# Patient Record
Sex: Male | Born: 1965
Health system: Southern US, Community
[De-identification: ages and names within clinical notes are randomized; demographics above are authoritative.]

## PROBLEM LIST (undated history)

## (undated) DIAGNOSIS — F32A Depression, unspecified: Secondary | ICD-10-CM

## (undated) DIAGNOSIS — F329 Major depressive disorder, single episode, unspecified: Secondary | ICD-10-CM

## (undated) DIAGNOSIS — I10 Essential (primary) hypertension: Secondary | ICD-10-CM

## (undated) DIAGNOSIS — G8929 Other chronic pain: Secondary | ICD-10-CM

## (undated) DIAGNOSIS — E119 Type 2 diabetes mellitus without complications: Secondary | ICD-10-CM

## (undated) DIAGNOSIS — T7840XA Allergy, unspecified, initial encounter: Secondary | ICD-10-CM

## (undated) HISTORY — PX: KNEE SURGERY: SHX244

## (undated) HISTORY — PX: BACK SURGERY: SHX140

## (undated) HISTORY — DX: Other chronic pain: G89.29

## (undated) HISTORY — DX: Depression, unspecified: F32.A

---
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
## 2004-09-10 ENCOUNTER — Ambulatory Visit: Payer: Self-pay | Admitting: Family Medicine

## 2004-10-08 ENCOUNTER — Ambulatory Visit: Payer: Self-pay | Admitting: Family Medicine

## 2005-01-12 ENCOUNTER — Emergency Department: Payer: Self-pay | Admitting: Emergency Medicine

## 2005-06-11 ENCOUNTER — Inpatient Hospital Stay (HOSPITAL_COMMUNITY): Admission: RE | Admit: 2005-06-11 | Discharge: 2005-06-14 | Payer: Self-pay | Admitting: Neurosurgery

## 2006-08-13 ENCOUNTER — Encounter: Admission: RE | Admit: 2006-08-13 | Discharge: 2006-08-13 | Payer: Self-pay | Admitting: Neurosurgery

## 2006-09-08 ENCOUNTER — Encounter: Admission: RE | Admit: 2006-09-08 | Discharge: 2006-09-08 | Payer: Self-pay | Admitting: Neurosurgery

## 2007-02-26 ENCOUNTER — Encounter: Admission: RE | Admit: 2007-02-26 | Discharge: 2007-02-26 | Payer: Self-pay | Admitting: Neurosurgery

## 2008-05-30 ENCOUNTER — Ambulatory Visit: Payer: Self-pay | Admitting: Specialist

## 2008-06-15 ENCOUNTER — Ambulatory Visit: Payer: Self-pay | Admitting: Specialist

## 2008-06-22 ENCOUNTER — Ambulatory Visit: Payer: Self-pay | Admitting: Specialist

## 2009-08-09 ENCOUNTER — Ambulatory Visit: Payer: Self-pay | Admitting: Pain Medicine

## 2009-08-21 ENCOUNTER — Ambulatory Visit: Payer: Self-pay | Admitting: Pain Medicine

## 2009-08-28 ENCOUNTER — Ambulatory Visit: Payer: Self-pay | Admitting: Pain Medicine

## 2009-09-07 ENCOUNTER — Ambulatory Visit: Payer: Self-pay | Admitting: Pain Medicine

## 2009-09-25 ENCOUNTER — Ambulatory Visit: Payer: Self-pay | Admitting: Pain Medicine

## 2009-11-02 ENCOUNTER — Ambulatory Visit: Payer: Self-pay | Admitting: Pain Medicine

## 2009-11-14 ENCOUNTER — Ambulatory Visit: Payer: Self-pay | Admitting: Pain Medicine

## 2009-11-21 ENCOUNTER — Ambulatory Visit: Payer: Self-pay | Admitting: Pain Medicine

## 2009-12-13 ENCOUNTER — Ambulatory Visit: Payer: Self-pay | Admitting: Pain Medicine

## 2009-12-19 ENCOUNTER — Ambulatory Visit: Payer: Self-pay | Admitting: Pain Medicine

## 2009-12-28 ENCOUNTER — Ambulatory Visit: Payer: Self-pay | Admitting: Pain Medicine

## 2010-07-01 ENCOUNTER — Encounter: Payer: Self-pay | Admitting: Neurosurgery
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
## 2011-07-21 ENCOUNTER — Ambulatory Visit: Payer: Self-pay | Admitting: Specialist

## 2011-08-11 ENCOUNTER — Emergency Department: Payer: Self-pay | Admitting: Unknown Physician Specialty

## 2011-08-11 LAB — BASIC METABOLIC PANEL
Co2: 26 mmol/L (ref 21–32)
Creatinine: 0.95 mg/dL (ref 0.60–1.30)
EGFR (African American): >60
Sodium: 130 mmol/L — ABNORMAL LOW (ref 136–145)

## 2011-08-11 LAB — HEPATIC FUNCTION PANEL A (ARMC)
Bilirubin, Direct: 0.1 mg/dL (ref 0.00–0.20)
SGOT(AST): 16 U/L (ref 15–37)

## 2011-08-11 LAB — CBC
MCH: 29.5 pg (ref 26.0–34.0)
MCHC: 34.2 g/dL (ref 32.0–36.0)
RDW: 12.3 % (ref 11.5–14.5)

## 2011-08-11 LAB — TROPONIN I: Troponin-I: <0.02 ng/mL

## 2011-11-05 ENCOUNTER — Ambulatory Visit: Payer: Self-pay | Admitting: Pain Medicine

## 2011-11-07 ENCOUNTER — Ambulatory Visit: Payer: Self-pay | Admitting: Pain Medicine

## 2011-12-03 ENCOUNTER — Ambulatory Visit: Payer: Self-pay | Admitting: Pain Medicine

## 2011-12-11 ENCOUNTER — Ambulatory Visit: Payer: Self-pay | Admitting: Pain Medicine

## 2011-12-20 ENCOUNTER — Other Ambulatory Visit: Payer: Self-pay | Admitting: Pain Medicine

## 2011-12-20 LAB — HEMOGLOBIN A1C: Hemoglobin A1C: 8.3 % — ABNORMAL HIGH (ref 4.2–6.3)

## 2011-12-20 LAB — GLUCOSE, RANDOM: Glucose: 198 mg/dL — ABNORMAL HIGH (ref 65–99)

## 2011-12-24 ENCOUNTER — Ambulatory Visit: Payer: Self-pay | Admitting: Pain Medicine

## 2012-01-13 ENCOUNTER — Ambulatory Visit: Payer: Self-pay | Admitting: Pain Medicine

## 2012-01-14 ENCOUNTER — Ambulatory Visit: Payer: Self-pay | Admitting: Pain Medicine

## 2012-01-27 ENCOUNTER — Ambulatory Visit: Payer: Self-pay | Admitting: Pain Medicine

## 2012-03-16 ENCOUNTER — Ambulatory Visit: Payer: Self-pay | Admitting: Pain Medicine

## 2012-05-13 ENCOUNTER — Ambulatory Visit: Payer: Self-pay | Admitting: Pain Medicine

## 2012-07-13 ENCOUNTER — Ambulatory Visit: Payer: Self-pay | Admitting: Pain Medicine

## 2012-08-10 ENCOUNTER — Ambulatory Visit: Payer: Self-pay | Admitting: Pain Medicine

## 2012-09-11 ENCOUNTER — Ambulatory Visit: Payer: Self-pay | Admitting: Pain Medicine

## 2012-09-16 ENCOUNTER — Ambulatory Visit: Payer: Self-pay | Admitting: Specialist

## 2012-10-09 ENCOUNTER — Ambulatory Visit: Payer: Self-pay | Admitting: Pain Medicine

## 2012-10-15 ENCOUNTER — Ambulatory Visit: Payer: Self-pay | Admitting: Specialist

## 2012-10-15 LAB — BASIC METABOLIC PANEL
Anion Gap: 10 (ref 7–16)
Creatinine: 0.69 mg/dL (ref 0.60–1.30)
EGFR (African American): >60
Potassium: 4 mmol/L (ref 3.5–5.1)

## 2012-11-06 ENCOUNTER — Ambulatory Visit: Payer: Self-pay | Admitting: Pain Medicine

## 2012-11-26 ENCOUNTER — Ambulatory Visit: Payer: Self-pay | Admitting: Specialist

## 2012-12-04 ENCOUNTER — Ambulatory Visit: Payer: Self-pay | Admitting: Pain Medicine

## 2014-09-30 NOTE — Op Note (Signed)
PATIENT NAME:  Kevin Huff, [name] MR#:  [id number] DATE OF BIRTH:  [birth year]  DATE OF PROCEDURE:  11/26/2012  PREOPERATIVE DIAGNOSIS: Probable medial meniscus tear, left knee.   POSTOPERATIVE DIAGNOSES:  1. Tear, posterior horn, lateral meniscus, left knee.  2. Large hypertrophic medial suprapatellar plica.   PROCEDURES:  1. Arthroscopic partial lateral meniscectomy, left knee.  2. Excision of large medial suprapatellar plica, left knee.   SURGEON: Clare Gandyhristopher E. Kinan Safley, MD  ANESTHESIA: General.   COMPLICATIONS: None.   DESCRIPTION OF PROCEDURE: After adequate induction of general anesthesia, the left leg is placed in the legholder in the usual manner for arthroscopy. The left knee and leg are thoroughly prepped with alcohol and ChloraPrep and draped in standard sterile fashion. The joint is infiltrated with 10 mL of Marcaine with epinephrine. Diagnostic arthroscopy is performed. There is minimal synovitis present. The anterior femoral surface is normal. There is mild chondromalacia of the posterior aspect of the patella. There is a large medial suprapatellar hypertrophic plica present with some irritation of the medial femoral condyle. Using a combination of the full radial resector and the ArthroWand, the complete medial suprapatellar plica is completely excised. The medial compartment is then examined, and no abnormalities are noted. The medial meniscus is normal. In the intercondylar notch, the anterior cruciate ligament is normal. In the lateral compartment, there is seen to be a bucket-handle type tear of the posterior horn of the lateral meniscus. This is thoroughly probed, and then the torn portion of the meniscus is resected using the ArthroWand, the basket forceps and the full radial resector. The joint is then thoroughly irrigated multiple times. Skin is closed with 5-0 nylon. The joint is infiltrated with 16 mL of Marcaine with epinephrine with 4 mg of morphine. A soft bulky dressing is  applied. The patient is returned to the recovery room in satisfactory condition, having tolerated the procedure quite well.    ____________________________ Clare Gandyhristopher E. Tammala Weider, MD ces:OSi D: 11/27/2012 06:25:01 ET T: 11/27/2012 06:46:20 ET JOB#: [id number]  cc: Clare Gandyhristopher E. Everson Mott, MD, <Dictator> Clare GandyHRISTOPHER E Loreli Debruler MD ELECTRONICALLY SIGNED 11/27/2012 7:16

## 2015-07-22 ENCOUNTER — Emergency Department: Payer: No Typology Code available for payment source

## 2015-07-22 ENCOUNTER — Emergency Department
Admission: EM | Admit: 2015-07-22 | Discharge: 2015-07-22 | Disposition: A | Discharge status: Home / Self Care | Payer: No Typology Code available for payment source | Attending: Emergency Medicine | Admitting: Emergency Medicine

## 2015-07-22 ENCOUNTER — Encounter: Payer: Self-pay | Admitting: Emergency Medicine

## 2015-07-22 DIAGNOSIS — F1210 Cannabis abuse, uncomplicated: Secondary | ICD-10-CM | POA: Diagnosis not present

## 2015-07-22 DIAGNOSIS — S3992XA Unspecified injury of lower back, initial encounter: Secondary | ICD-10-CM | POA: Insufficient documentation

## 2015-07-22 DIAGNOSIS — F1310 Sedative, hypnotic or anxiolytic abuse, uncomplicated: Secondary | ICD-10-CM | POA: Diagnosis not present

## 2015-07-22 DIAGNOSIS — J439 Emphysema, unspecified: Secondary | ICD-10-CM | POA: Insufficient documentation

## 2015-07-22 DIAGNOSIS — Z880 Allergy status to penicillin: Secondary | ICD-10-CM | POA: Diagnosis not present

## 2015-07-22 DIAGNOSIS — S8991XA Unspecified injury of right lower leg, initial encounter: Secondary | ICD-10-CM | POA: Diagnosis not present

## 2015-07-22 DIAGNOSIS — S42102A Fracture of unspecified part of scapula, left shoulder, initial encounter for closed fracture: Secondary | ICD-10-CM | POA: Diagnosis not present

## 2015-07-22 DIAGNOSIS — Y998 Other external cause status: Secondary | ICD-10-CM | POA: Insufficient documentation

## 2015-07-22 DIAGNOSIS — Z9889 Other specified postprocedural states: Secondary | ICD-10-CM | POA: Diagnosis not present

## 2015-07-22 DIAGNOSIS — S27329A Contusion of lung, unspecified, initial encounter: Secondary | ICD-10-CM

## 2015-07-22 DIAGNOSIS — S199XXA Unspecified injury of neck, initial encounter: Secondary | ICD-10-CM | POA: Insufficient documentation

## 2015-07-22 DIAGNOSIS — Y92410 Unspecified street and highway as the place of occurrence of the external cause: Secondary | ICD-10-CM | POA: Diagnosis not present

## 2015-07-22 DIAGNOSIS — E119 Type 2 diabetes mellitus without complications: Secondary | ICD-10-CM | POA: Insufficient documentation

## 2015-07-22 DIAGNOSIS — I10 Essential (primary) hypertension: Secondary | ICD-10-CM | POA: Diagnosis not present

## 2015-07-22 DIAGNOSIS — S29001A Unspecified injury of muscle and tendon of front wall of thorax, initial encounter: Secondary | ICD-10-CM | POA: Diagnosis present

## 2015-07-22 DIAGNOSIS — Y9389 Activity, other specified: Secondary | ICD-10-CM | POA: Insufficient documentation

## 2015-07-22 HISTORY — DX: Essential (primary) hypertension: I10

## 2015-07-22 HISTORY — DX: Type 2 diabetes mellitus without complications: E11.9

## 2015-07-22 LAB — URINALYSIS COMPLETE WITH MICROSCOPIC (ARMC ONLY)
Bacteria, UA: NONE SEEN
Bilirubin Urine: NEGATIVE
Glucose, UA: >500 mg/dL — AB
Hgb urine dipstick: NEGATIVE
Ketones, ur: NEGATIVE mg/dL
Leukocytes, UA: NEGATIVE
Nitrite: NEGATIVE
PROTEIN: NEGATIVE mg/dL
RBC / HPF: NONE SEEN RBC/hpf (ref 0–5)
SQUAMOUS EPITHELIAL / LPF: NONE SEEN
Specific Gravity, Urine: 1.039 — ABNORMAL HIGH (ref 1.005–1.030)
WBC UA: NONE SEEN WBC/hpf (ref 0–5)
pH: 5 (ref 5.0–8.0)

## 2015-07-22 LAB — CBC
HEMATOCRIT: 46.7 % (ref 40.0–52.0)
HEMOGLOBIN: 15.9 g/dL (ref 13.0–18.0)
MCH: 28.9 pg (ref 26.0–34.0)
MCHC: 34 g/dL (ref 32.0–36.0)
MCV: 85.2 fL (ref 80.0–100.0)
Platelets: 220 10*3/uL (ref 150–440)
RBC: 5.48 MIL/uL (ref 4.40–5.90)
RDW: 13.7 % (ref 11.5–14.5)
WBC: 7.2 10*3/uL (ref 3.8–10.6)

## 2015-07-22 LAB — URINE DRUG SCREEN, QUALITATIVE (ARMC ONLY)
AMPHETAMINES, UR SCREEN: NOT DETECTED
BARBITURATES, UR SCREEN: NOT DETECTED
BENZODIAZEPINE, UR SCRN: POSITIVE — AB
CANNABINOID 50 NG, UR ~~LOC~~: POSITIVE — AB
Cocaine Metabolite,Ur ~~LOC~~: NOT DETECTED
MDMA (Ecstasy)Ur Screen: NOT DETECTED
Methadone Scn, Ur: NOT DETECTED
OPIATE, UR SCREEN: NOT DETECTED
PHENCYCLIDINE (PCP) UR S: NOT DETECTED
Tricyclic, Ur Screen: NOT DETECTED

## 2015-07-22 LAB — BASIC METABOLIC PANEL
ANION GAP: 7 (ref 5–15)
BUN: 12 mg/dL (ref 6–20)
CALCIUM: 9.6 mg/dL (ref 8.9–10.3)
CO2: 29 mmol/L (ref 22–32)
Chloride: 104 mmol/L (ref 101–111)
Creatinine, Ser: 0.86 mg/dL (ref 0.61–1.24)
GLUCOSE: 238 mg/dL — AB (ref 65–99)
POTASSIUM: 4.4 mmol/L (ref 3.5–5.1)
Sodium: 140 mmol/L (ref 135–145)

## 2015-07-22 LAB — TROPONIN I

## 2015-07-22 LAB — ETHANOL

## 2015-07-22 MED ORDER — SODIUM CHLORIDE 0.9 % IV BOLUS (SEPSIS)
1000.0000 mL | Freq: Once | INTRAVENOUS | Status: AC
  Administered 2015-07-22: 1000 mL via INTRAVENOUS

## 2015-07-22 MED ORDER — IOHEXOL 300 MG/ML  SOLN
125.0000 mL | Freq: Once | INTRAMUSCULAR | Status: AC | PRN
  Administered 2015-07-22: 125 mL via INTRAVENOUS

## 2015-07-22 MED ORDER — FENTANYL CITRATE (PF) 100 MCG/2ML IJ SOLN: 100.0000 ug | Freq: Once | INTRAMUSCULAR | Status: DC

## 2015-07-22 MED ORDER — FENTANYL CITRATE (PF) 100 MCG/2ML IJ SOLN
50.0000 ug | Freq: Once | INTRAMUSCULAR | Status: AC
  Administered 2015-07-22: 50 ug via INTRAVENOUS
  Filled 2015-07-22: qty 2

## 2015-07-22 NOTE — ED Notes (Signed)
Pt transported to CT ?
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
## 2015-07-22 NOTE — ED Notes (Signed)
Pt returned from CT °

## 2015-07-22 NOTE — ED Notes (Signed)
NAD noted at time of D/C. Pt taken to the lobby via wheelchair by his wife. Pt states "I wish i could get my pain below a 10." Per Dr. Pershing Proud, pt is to take his 10-325 Percocet that he has at home when he gets home, then take as prescribed Q6. Pt states "But i'll be in bed asleep by the time it is time to take my next pill". This RN instructed patient that per Dr. Pershing Proud, take percocet when he gets home then as prescribed.

## 2015-07-26 ENCOUNTER — Other Ambulatory Visit: Payer: Self-pay | Admitting: Internal Medicine

## 2015-07-26 DIAGNOSIS — S27321A Contusion of lung, unilateral, initial encounter: Secondary | ICD-10-CM

## 2015-08-01 ENCOUNTER — Ambulatory Visit
Admission: RE | Admit: 2015-08-01 | Discharge: 2015-08-01 | Disposition: A | Discharge status: Home / Self Care | Payer: Medicare Other | Source: Ambulatory Visit | Attending: Internal Medicine | Admitting: Internal Medicine

## 2015-08-01 DIAGNOSIS — X58XXXS Exposure to other specified factors, sequela: Secondary | ICD-10-CM | POA: Diagnosis not present

## 2015-08-01 DIAGNOSIS — S27321S Contusion of lung, unilateral, sequela: Secondary | ICD-10-CM | POA: Insufficient documentation

## 2015-08-01 DIAGNOSIS — I2510 Atherosclerotic heart disease of native coronary artery without angina pectoris: Secondary | ICD-10-CM | POA: Insufficient documentation

## 2015-08-01 DIAGNOSIS — J439 Emphysema, unspecified: Secondary | ICD-10-CM | POA: Diagnosis not present

## 2015-08-01 DIAGNOSIS — S27321A Contusion of lung, unilateral, initial encounter: Secondary | ICD-10-CM

## 2015-08-01 MED ORDER — IOHEXOL 350 MG/ML SOLN
75.0000 mL | Freq: Once | INTRAVENOUS | Status: AC | PRN
  Administered 2015-08-01: 75 mL via INTRAVENOUS

## 2015-09-05 ENCOUNTER — Ambulatory Visit: Payer: Medicare Other | Attending: Sports Medicine | Admitting: Physical Therapy

## 2015-09-05 DIAGNOSIS — X58XXXA Exposure to other specified factors, initial encounter: Secondary | ICD-10-CM | POA: Insufficient documentation

## 2015-09-05 DIAGNOSIS — S42102A Fracture of unspecified part of scapula, left shoulder, initial encounter for closed fracture: Secondary | ICD-10-CM | POA: Insufficient documentation

## 2015-09-05 DIAGNOSIS — M7582 Other shoulder lesions, left shoulder: Secondary | ICD-10-CM | POA: Diagnosis present

## 2015-09-05 DIAGNOSIS — M25512 Pain in left shoulder: Secondary | ICD-10-CM | POA: Diagnosis present

## 2015-09-05 DIAGNOSIS — M25612 Stiffness of left shoulder, not elsewhere classified: Secondary | ICD-10-CM

## 2015-09-05 NOTE — Patient Instructions (Signed)
All exercises provided were adapted from [email or web address]. Patient was provided a written handout with pictures as described. Any additional cues were manually entered in to handout and copied in to this document.  SCAPULAR RETRACTIONS  Draw your shoulder blades back and down.    Seated Thoracic Extension  Sit with your back against the top of a chair or with foam roll/noodle behind and perpendicular to your spine. Support your neck with your hands and tuck elbows forward and together. Gently arch back to mobilize spine at level where chair contacts spine. Hold 1-2 seconds then slowly return. Repeat. Repeat at 3 separate levels throughout mid-back, starting down near bottom of ribs and moving up toward top of shoulder blades.
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
## 2015-09-07 ENCOUNTER — Ambulatory Visit: Payer: Medicare Other | Admitting: Physical Therapy

## 2015-09-07 DIAGNOSIS — M25612 Stiffness of left shoulder, not elsewhere classified: Secondary | ICD-10-CM

## 2015-09-07 DIAGNOSIS — S42102A Fracture of unspecified part of scapula, left shoulder, initial encounter for closed fracture: Secondary | ICD-10-CM | POA: Diagnosis not present

## 2015-09-07 DIAGNOSIS — M25512 Pain in left shoulder: Secondary | ICD-10-CM
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
## 2015-09-12 ENCOUNTER — Ambulatory Visit: Payer: Medicare Other | Attending: Sports Medicine | Admitting: Physical Therapy

## 2015-09-12 DIAGNOSIS — M25512 Pain in left shoulder: Secondary | ICD-10-CM | POA: Diagnosis present

## 2015-09-12 DIAGNOSIS — M7582 Other shoulder lesions, left shoulder: Secondary | ICD-10-CM | POA: Diagnosis present

## 2015-09-12 DIAGNOSIS — M25612 Stiffness of left shoulder, not elsewhere classified: Secondary | ICD-10-CM

## 2015-09-12 DIAGNOSIS — M79602 Pain in left arm: Secondary | ICD-10-CM | POA: Insufficient documentation

## 2015-09-12 DIAGNOSIS — X58XXXA Exposure to other specified factors, initial encounter: Secondary | ICD-10-CM | POA: Diagnosis not present

## 2015-09-12 DIAGNOSIS — S42102A Fracture of unspecified part of scapula, left shoulder, initial encounter for closed fracture: Secondary | ICD-10-CM | POA: Diagnosis not present
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
## 2015-09-14 ENCOUNTER — Ambulatory Visit: Payer: Medicare Other | Admitting: Physical Therapy

## 2015-09-14 ENCOUNTER — Encounter: Payer: Medicare Other | Admitting: Physical Therapy

## 2015-09-19 ENCOUNTER — Ambulatory Visit: Payer: Medicare Other | Admitting: Physical Therapy

## 2015-09-19 DIAGNOSIS — M25612 Stiffness of left shoulder, not elsewhere classified: Secondary | ICD-10-CM

## 2015-09-19 DIAGNOSIS — S42102A Fracture of unspecified part of scapula, left shoulder, initial encounter for closed fracture: Secondary | ICD-10-CM | POA: Diagnosis not present

## 2015-09-19 DIAGNOSIS — M25512 Pain in left shoulder: Secondary | ICD-10-CM
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
## 2015-09-21 ENCOUNTER — Ambulatory Visit: Payer: Medicare Other | Admitting: Physical Therapy

## 2015-09-21 DIAGNOSIS — S42102A Fracture of unspecified part of scapula, left shoulder, initial encounter for closed fracture: Secondary | ICD-10-CM | POA: Diagnosis not present

## 2015-09-21 DIAGNOSIS — M79602 Pain in left arm: Secondary | ICD-10-CM
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
## 2015-09-26 ENCOUNTER — Ambulatory Visit: Payer: Medicare Other

## 2015-09-26 DIAGNOSIS — M79602 Pain in left arm: Secondary | ICD-10-CM

## 2015-09-26 DIAGNOSIS — M25512 Pain in left shoulder: Secondary | ICD-10-CM

## 2015-09-26 DIAGNOSIS — M25612 Stiffness of left shoulder, not elsewhere classified: Secondary | ICD-10-CM

## 2015-09-26 DIAGNOSIS — S42102A Fracture of unspecified part of scapula, left shoulder, initial encounter for closed fracture: Secondary | ICD-10-CM | POA: Diagnosis not present
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
## 2015-09-28 ENCOUNTER — Ambulatory Visit: Payer: Medicare Other | Admitting: Physical Therapy

## 2015-10-03 ENCOUNTER — Ambulatory Visit: Payer: Medicare Other | Admitting: Physical Therapy

## 2015-10-03 DIAGNOSIS — M79602 Pain in left arm: Secondary | ICD-10-CM

## 2015-10-03 DIAGNOSIS — S42102A Fracture of unspecified part of scapula, left shoulder, initial encounter for closed fracture: Secondary | ICD-10-CM | POA: Diagnosis not present
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
## 2015-10-05 ENCOUNTER — Ambulatory Visit: Payer: Medicare Other | Admitting: Physical Therapy

## 2015-10-05 DIAGNOSIS — M79602 Pain in left arm: Secondary | ICD-10-CM

## 2015-10-05 DIAGNOSIS — S42102A Fracture of unspecified part of scapula, left shoulder, initial encounter for closed fracture: Secondary | ICD-10-CM | POA: Diagnosis not present
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
## 2015-10-10 ENCOUNTER — Encounter: Payer: Medicare Other | Admitting: Physical Therapy

## 2017-11-26 ENCOUNTER — Encounter: Payer: Self-pay | Admitting: Family Medicine

## 2017-11-26 ENCOUNTER — Encounter: Payer: Self-pay | Admitting: Emergency Medicine

## 2017-11-26 ENCOUNTER — Ambulatory Visit (INDEPENDENT_AMBULATORY_CARE_PROVIDER_SITE_OTHER): Payer: Medicare Other | Admitting: Family Medicine

## 2017-11-26 VITALS — BP 130/82 | HR 90 | Temp 98.2°F | Ht 73.5 in | Wt 278.5 lb

## 2017-11-26 DIAGNOSIS — F1190 Opioid use, unspecified, uncomplicated: Secondary | ICD-10-CM

## 2017-11-26 DIAGNOSIS — F419 Anxiety disorder, unspecified: Secondary | ICD-10-CM

## 2017-11-26 DIAGNOSIS — Z7689 Persons encountering health services in other specified circumstances: Secondary | ICD-10-CM

## 2017-11-26 DIAGNOSIS — M545 Low back pain: Secondary | ICD-10-CM

## 2017-11-26 DIAGNOSIS — G4700 Insomnia, unspecified: Secondary | ICD-10-CM

## 2017-11-26 DIAGNOSIS — E559 Vitamin D deficiency, unspecified: Secondary | ICD-10-CM

## 2017-11-26 DIAGNOSIS — E1165 Type 2 diabetes mellitus with hyperglycemia: Secondary | ICD-10-CM | POA: Diagnosis not present

## 2017-11-26 DIAGNOSIS — Z794 Long term (current) use of insulin: Secondary | ICD-10-CM | POA: Diagnosis not present

## 2017-11-26 DIAGNOSIS — Z6836 Body mass index (BMI) 36.0-36.9, adult: Secondary | ICD-10-CM

## 2017-11-26 DIAGNOSIS — I10 Essential (primary) hypertension: Secondary | ICD-10-CM

## 2017-11-26 DIAGNOSIS — F32A Depression, unspecified: Secondary | ICD-10-CM

## 2017-11-26 DIAGNOSIS — G8929 Other chronic pain: Secondary | ICD-10-CM

## 2017-11-26 DIAGNOSIS — F329 Major depressive disorder, single episode, unspecified: Secondary | ICD-10-CM

## 2017-11-26 DIAGNOSIS — J3089 Other allergic rhinitis: Secondary | ICD-10-CM

## 2017-11-26 LAB — POCT GLYCOSYLATED HEMOGLOBIN (HGB A1C): Hemoglobin A1C: 11.1 % — AB (ref 4.0–5.6)

## 2017-11-26 MED ORDER — OXYCODONE-ACETAMINOPHEN 10-325 MG PO TABS: 1.0000 | ORAL_TABLET | Freq: Two times a day (BID) | ORAL | 0 refills | Status: DC | PRN

## 2017-11-26 MED ORDER — LISINOPRIL 20 MG PO TABS: 20.0000 mg | ORAL_TABLET | Freq: Every day | ORAL | 1 refills | Status: DC

## 2017-11-26 MED ORDER — MELOXICAM 15 MG PO TABS: 15.0000 mg | ORAL_TABLET | Freq: Every day | ORAL | 1 refills | Status: DC

## 2017-11-26 MED ORDER — ALPRAZOLAM 0.5 MG PO TABS: 0.5000 mg | ORAL_TABLET | Freq: Two times a day (BID) | ORAL | 0 refills | Status: DC | PRN

## 2017-11-26 MED ORDER — TRAZODONE HCL 100 MG PO TABS
100.0000 mg | ORAL_TABLET | Freq: Every evening | ORAL | 1 refills | Status: DC | PRN
Start: 2017-11-26 — End: 2018-02-27

## 2017-11-26 MED ORDER — METFORMIN HCL 1000 MG PO TABS: ORAL_TABLET | ORAL | 4 refills | Status: DC

## 2017-11-26 MED ORDER — GLIPIZIDE 5 MG PO TABS: 5.0000 mg | ORAL_TABLET | Freq: Two times a day (BID) | ORAL | 3 refills | Status: DC

## 2017-11-26 NOTE — Patient Instructions (Addendum)
Good to meet you today  For your allergies, you can take generic Zyrtec (cetirizine), Allegra (fexofenadine) or Claritin (loratadine)  Please call your insurance and ask them what the preferred long acting insulin is on your plan- Lantus, Basaglar or Toujeo and how much it will cost you. If it is prohibitively expensive, we may need to do a short acting more frequent medication. Call the office at [phone] and let me know what you find out.   Please schedule a follow up appointment for a physical exam in 1 month    Stop your Tujero and Jardiance, I have sent in new oral meds to your pharmacy  Check your blood sugar once a day and bring your meter with you to your next visit

## 2017-11-26 NOTE — Progress Notes (Signed)
Subjective:    Patient ID: Kevin Huff, male    DOB: [birth year], 52 y.o.   MRN: [id number]  HPI This is a 52 yo male who presents today to establish care. He is from [place] He is married, has a grown daughter, two grand children. Enjoys fishing. Has been disabled since 2006. Has had multiple back surgeries.  Was previously seeing Dr. Maryellen PileEason for last 10 years. He has retired. Patient is about to run out of all his medications.   DM type 2- currently on Toujeo 40 units daily, Jardiance 25 mg po qd. Does not watch diet carefully. Rarely checks his blood sugar. He is unable to afford these medications and was previously getting samples from Dr. Jake ChurchEason's office. He has not been taking his medications as prescribed because he has been trying to make them last. He is unable to recall what other medications he has been on for his diabetes and any intolerances/ ineffectiveness.   Chronic pain- has chronic back pain, has been unable to afford to see a specialist. Has been trying to make oxycodone- acetaminophen last. Also takes alprazolam for panic attacks. Takes 1 three times a day. Has had to use sparingly. Also takes meloxicam 15 mg daily- he is out of this. Has been on gabapentin and lyrica (2018 14 day supply on [facility]) in past without improvement of pain. Has been mostly confined to bed recently due to pain. Reports significant anxiety related to getting his prescriptions.   Last CPE- last year PSA- unknown Colonoscopy- never, declines Tdap- 06/26/2007 Flu- annual Dental- full dentures Eye-  2-3 years ago, unable to afford co-pay for eye exam Exercise- has not been able to exercise in last month due to pain. Has [employer] membership.  Sleep- improved with trazodone but is currently out  Denies, chest pain, SOB, abdominal pain, nausea/vomiting/diarrhea/constipation. Has frequent urination.   Past Medical History:  Diagnosis Date  . Diabetes mellitus without complication (HCC)   . Hypertension     Past Surgical History:  Procedure Laterality Date  . BACK SURGERY    . KNEE SURGERY     Family History  Problem Relation Age of Onset  . Cancer Mother   . Hyperlipidemia Father   . Hypertension Father   . Diabetes Father   . Heart disease Father    Social History   Tobacco Use  . Smoking status: Never Smoker  . Smokeless tobacco: Never Used  Substance Use Topics  . Alcohol use: No  . Drug use: Never     Review of Systems Per HPI    Objective:   Physical Exam  Constitutional: He is oriented to person, place, and time. He appears well-developed and well-nourished.  Obese.   HENT:  Head: Normocephalic and atraumatic.  Eyes: Conjunctivae are normal.  Cardiovascular: Normal rate.  Pulmonary/Chest: Effort normal.  Neurological: He is alert and oriented to person, place, and time.  Psychiatric: His mood appears anxious. He is agitated. Cognition and memory are normal.  Vitals reviewed.     BP 130/82 (BP Location: Right Arm, Patient Position: Sitting, Cuff Size: Large)   Pulse 90   Temp 98.2 F (36.8 C) (Oral)   Ht 6' 1.5" (1.867 m)   Wt 278 lb 8 oz (126.3 kg)   SpO2 97%   BMI 36.25 kg/m  Wt Readings from Last 3 Encounters:  11/26/17 278 lb 8 oz (126.3 kg)  07/22/15 275 lb (124.7 kg)   Dep[facility] screen [facility] 11/26/2017  Decreased Interest  1  Down, Depressed, Hopeless 1  PHQ - 2 Score 2   Results for orders placed or performed in visit on 11/26/17  HgB A1c  Result Value Ref Range   Hemoglobin A1C 11.1 (A) 4.0 - 5.6 %   HbA1c, POC (prediabetic range)  5.7 - 6.4 %   HbA1c, POC (controlled diabetic range)  0.0 - 7.0 %       Assessment & Plan:  1. Encounter to establish care - Will attempt to get records from previous PCP - Follow up in 1 month for CPE  2. Type 2 diabetes mellitus with hyperglycemia, with long-term current use of insulin (HCC) - hgba1c 11.1, has not been taking medications regularly. Unable to afford long acting insulin. Discussed  trying oral agents and importance of checking blood sugar daily and watching diet. Discussed potential side effects of medication - may need to use inexpensive short acting insulin if blood sugars not improved on oral agents - CBC with Differential - Comprehensive metabolic panel - HgB A1c - Lipid Panel - lisinopril (PRINIVIL,ZESTRIL) 20 MG tablet; Take 1 tablet (20 mg total) by mouth daily.  Dispense: 90 tablet; Refill: 1 - metFORMIN (GLUCOPHAGE) 1000 MG tablet; Take 1/2 tablet with dinner x 3 days, then 1/2 twice a day for 3 days, then 1 tablet twice a day with meals  Dispense: 60 tablet; Refill: 4 - glipiZIDE (GLUCOTROL) 5 MG tablet; Take 1 tablet (5 mg total) by mouth 2 (two) times daily before a meal.  Dispense: 60 tablet; Refill: 3  3. Essential hypertension - well controlled today, continue lisinopril for kidney protective benefits - Vitamin D, 25-hydroxy - lisinopril (PRINIVIL,ZESTRIL) 20 MG tablet; Take 1 tablet (20 mg total) by mouth daily.  Dispense: 90 tablet; Refill: 1  4. Chronic low back pain, unspecified back pain laterality, with sciatica presence unspecified - meloxicam (MOBIC) 15 MG tablet; Take 1 tablet (15 mg total) by mouth daily.  Dispense: 90 tablet; Refill: 1 - oxyCODONE-acetaminophen (PERCOCET) 10-325 MG tablet; Take 1 tablet by mouth 2 (two) times daily as needed for pain.  Dispense: 60 tablet; Refill: 0  5. Anxiety and depression - discussed use of alprazolam as inadequate to treat long term anxiety. I agreed to provide twice a day supply for now and will continue discussing further weaning and treatment with different medication (SSRI/SNRI). Discussed concern about him taking opioids and benzo together.  - Vitamin D, 25-hydroxy - ALPRAZolam (XANAX) 0.5 MG tablet; Take 1 tablet (0.5 mg total) by mouth 2 (two) times daily as needed for anxiety.  Dispense: 60 tablet; Refill: 0  6. Class 2 severe obesity due to excess calories with serious comorbidity and body mass  index (BMI) of 36.0 to 36.9 in adult [facility]) - Vitamin D, 25-hydroxy  7. Non-seasonal allergic rhinitis, unspecified trigger - discussed otc long acting allergy meds  8. Chronic, continuous use of opioids - Had long discussion with patient regarding pain management contract and UDS. Patient initially refused signing contract and supplying urine. He was informed of policies and eventually agreed.  - Pain Mgmt, Profile 8 w/Conf, U - Indication for chronic opioid: Chronic back pain Medication and dose: oxycodone 10-325 BID PRN # pills per month: 60 Last UDS date: today Opioid Treatment Agreement signed (Y/N): Yes Opioid Treatment Agreement last reviewed with patient:  Yes NCCSRS reviewed this encounter (include red flags):  Yes-  - patient with one pharmacy and 2 pre scribers for last 2 years. Regularly received oxy-acetaminophen 10-325 QID and alprazolam TID.  Last oxy-acetaminophen fill was #90 08/07/17, alprazolam 0.5 mg #90 08/25/17  9. Insomnia, unspecified type - traZODone (DESYREL) 100 MG tablet; Take 1 tablet (100 mg total) by mouth at bedtime as needed for sleep.  Dispense: 90 tablet; Refill: 1  Over 45 minutes were spent face-to-face with the patient during this encounter and >50% of that time was spent on counseling and coordination of care  Olean Ree, FNP-BC  [facility], [facility]  11/27/2017 11:29 AM

## 2017-11-27 DIAGNOSIS — G8929 Other chronic pain: Secondary | ICD-10-CM | POA: Insufficient documentation

## 2017-11-27 DIAGNOSIS — F1190 Opioid use, unspecified, uncomplicated: Secondary | ICD-10-CM | POA: Insufficient documentation

## 2017-11-27 DIAGNOSIS — M545 Low back pain, unspecified: Secondary | ICD-10-CM | POA: Insufficient documentation

## 2017-11-27 DIAGNOSIS — Z794 Long term (current) use of insulin: Principal | ICD-10-CM

## 2017-11-27 DIAGNOSIS — Z6836 Body mass index (BMI) 36.0-36.9, adult: Secondary | ICD-10-CM

## 2017-11-27 DIAGNOSIS — F329 Major depressive disorder, single episode, unspecified: Secondary | ICD-10-CM | POA: Insufficient documentation

## 2017-11-27 DIAGNOSIS — G4700 Insomnia, unspecified: Secondary | ICD-10-CM | POA: Insufficient documentation

## 2017-11-27 DIAGNOSIS — F419 Anxiety disorder, unspecified: Secondary | ICD-10-CM

## 2017-11-27 DIAGNOSIS — I10 Essential (primary) hypertension: Secondary | ICD-10-CM | POA: Insufficient documentation

## 2017-11-27 DIAGNOSIS — E1165 Type 2 diabetes mellitus with hyperglycemia: Secondary | ICD-10-CM | POA: Insufficient documentation

## 2017-11-27 DIAGNOSIS — F32A Depression, unspecified: Secondary | ICD-10-CM | POA: Insufficient documentation

## 2017-11-27 LAB — LIPID PANEL
CHOLESTEROL: 221 mg/dL — AB (ref 0–200)
HDL: 40.5 mg/dL (ref >39.00)
LDL Cholesterol: 141 mg/dL — ABNORMAL HIGH (ref 0–99)
NonHDL: 180.77
Total CHOL/HDL Ratio: 5
Triglycerides: 200 mg/dL — ABNORMAL HIGH (ref 0.0–149.0)
VLDL: 40 mg/dL (ref 0.0–40.0)

## 2017-11-27 LAB — CBC WITH DIFFERENTIAL/PLATELET
BASOS PCT: 1.3 % (ref 0.0–3.0)
Basophils Absolute: 0.1 10*3/uL (ref 0.0–0.1)
EOS PCT: 1.6 % (ref 0.0–5.0)
Eosinophils Absolute: 0.1 10*3/uL (ref 0.0–0.7)
HCT: 52 % (ref 39.0–52.0)
Lymphocytes Relative: 28.2 % (ref 12.0–46.0)
Lymphs Abs: 2.1 10*3/uL (ref 0.7–4.0)
MCHC: 34.6 g/dL (ref 30.0–36.0)
MCV: 85.1 fl (ref 78.0–100.0)
MONO ABS: 0.6 10*3/uL (ref 0.1–1.0)
Monocytes Relative: 7.8 % (ref 3.0–12.0)
NEUTROS ABS: 4.7 10*3/uL (ref 1.4–7.7)
Neutrophils Relative %: 61.1 % (ref 43.0–77.0)
PLATELETS: 251 10*3/uL (ref 150.0–400.0)
RBC: 6.11 Mil/uL — ABNORMAL HIGH (ref 4.22–5.81)
RDW: 13.5 % (ref 11.5–15.5)
WBC: 7.6 10*3/uL (ref 4.0–10.5)

## 2017-11-27 LAB — COMPREHENSIVE METABOLIC PANEL
ALT: 29 U/L (ref 0–53)
AST: 17 U/L (ref 0–37)
Albumin: 4.6 g/dL (ref 3.5–5.2)
Alkaline Phosphatase: 59 U/L (ref 39–117)
BUN: 16 mg/dL (ref 6–23)
CALCIUM: 10.1 mg/dL (ref 8.4–10.5)
CHLORIDE: 101 meq/L (ref 96–112)
CO2: 26 meq/L (ref 19–32)
Creatinine, Ser: 0.86 mg/dL (ref 0.40–1.50)
GFR: 98.4 mL/min (ref >60.00)
Glucose, Bld: 178 mg/dL — ABNORMAL HIGH (ref 70–99)
POTASSIUM: 4.8 meq/L (ref 3.5–5.1)
SODIUM: 137 meq/L (ref 135–145)
Total Bilirubin: 0.8 mg/dL (ref 0.2–1.2)
Total Protein: 7.9 g/dL (ref 6.0–8.3)

## 2017-11-27 LAB — VITAMIN D 25 HYDROXY (VIT D DEFICIENCY, FRACTURES): VITD: 27.25 ng/mL — AB (ref 30.00–100.00)

## 2017-11-28 ENCOUNTER — Telehealth: Payer: Self-pay | Admitting: Family Medicine

## 2017-11-28 LAB — PAIN MGMT, PROFILE 8 W/CONF, U
6 ACETYLMORPHINE: NEGATIVE ng/mL (ref <10)
AMPHETAMINES: NEGATIVE ng/mL (ref <500)
Alcohol Metabolites: NEGATIVE ng/mL (ref <500)
BUPRENORPHINE, URINE: NEGATIVE ng/mL (ref <5)
Benzodiazepines: NEGATIVE ng/mL (ref <100)
Cocaine Metabolite: NEGATIVE ng/mL (ref <150)
Creatinine: 51.4 mg/dL
MARIJUANA METABOLITE: POSITIVE ng/mL — AB (ref <20)
MDMA: NEGATIVE ng/mL (ref <500)
Marijuana Metabolite: 725 ng/mL — ABNORMAL HIGH (ref <5)
OPIATES: NEGATIVE ng/mL (ref <100)
Oxidant: NEGATIVE ug/mL (ref <200)
Oxycodone: NEGATIVE ng/mL (ref <100)
PH: 5.78 (ref 4.5–9.0)

## 2017-11-28 NOTE — Telephone Encounter (Signed)
Spoke to pt and advised Rx sent 

## 2017-11-28 NOTE — Telephone Encounter (Signed)
Please call patient and tell him we will proceed with the oral medications I sent to his pharmacy.

## 2017-11-28 NOTE — Telephone Encounter (Signed)
Copied from CRM [id number]. Topic: Quick Communication - See Telephone Encounter >> Nov 28, 2017 10:51 AM Lorrine KinMcGee, Paysley Poplar B, NT wrote: CRM for notification. See Telephone encounter for: 11/28/17. Patient calling and states that Gavin PoundDeborah wanted him to call his insurance and find out with tier the following medications were on. Lantis, toujeo are tier 3 and dasagar is a tier 4. States that the tier 3 medications would cost him $153 a month. States that is just too expensive for him. Please advise.  CB#: [phone]

## 2017-12-29 ENCOUNTER — Encounter: Payer: Self-pay | Admitting: Family Medicine

## 2017-12-29 ENCOUNTER — Ambulatory Visit (INDEPENDENT_AMBULATORY_CARE_PROVIDER_SITE_OTHER): Payer: Medicare Other | Admitting: Family Medicine

## 2017-12-29 VITALS — BP 162/90 | HR 93 | Temp 97.5°F | Ht 73.75 in | Wt 290.0 lb

## 2017-12-29 DIAGNOSIS — M545 Low back pain: Secondary | ICD-10-CM

## 2017-12-29 DIAGNOSIS — G8929 Other chronic pain: Secondary | ICD-10-CM

## 2017-12-29 DIAGNOSIS — Z0001 Encounter for general adult medical examination with abnormal findings: Secondary | ICD-10-CM | POA: Diagnosis not present

## 2017-12-29 DIAGNOSIS — F419 Anxiety disorder, unspecified: Secondary | ICD-10-CM

## 2017-12-29 DIAGNOSIS — F32A Depression, unspecified: Secondary | ICD-10-CM

## 2017-12-29 DIAGNOSIS — Z0000 Encounter for general adult medical examination without abnormal findings: Secondary | ICD-10-CM

## 2017-12-29 DIAGNOSIS — F329 Major depressive disorder, single episode, unspecified: Secondary | ICD-10-CM

## 2017-12-29 MED ORDER — ALPRAZOLAM 0.5 MG PO TABS: 0.5000 mg | ORAL_TABLET | Freq: Two times a day (BID) | ORAL | 0 refills | Status: DC | PRN

## 2017-12-29 MED ORDER — DULOXETINE HCL 30 MG PO CPEP: 30.0000 mg | ORAL_CAPSULE | Freq: Every day | ORAL | 3 refills | Status: DC

## 2017-12-29 MED ORDER — OXYCODONE-ACETAMINOPHEN 10-325 MG PO TABS: 1.0000 | ORAL_TABLET | Freq: Two times a day (BID) | ORAL | 0 refills | Status: DC | PRN

## 2017-12-29 NOTE — Patient Instructions (Addendum)
Please let me know if cough and sore throat are not better in a couple of days  I have sent in new medicine for anxiety and pain- duloxetine, take once a day   [name] ,  For allergies, please try over the counter cetirizine or loratadine instead of your Allegra  Use antifungal cream on feet once a day until improved, may take several weeks. Moisturize feet at night  Follow up in 1 month, bring your blood sugar machine  Thank you for taking time to come for your Medicare Wellness Visit. I appreciate your ongoing commitment to your health goals. Please review the following plan we discussed and let me know if I can assist you in the future.   These are the goals we discussed: Goals    Decrease starchy foods Increase physical activity by 10 minutes a day- swim, recumbent bike, walking, stretching      This is a list of the screening recommended for you and due dates:  Health Maintenance  Topic Date Due  .  Hepatitis C: One time screening is recommended by Center for Disease Control  (CDC) for  adults born from [birth year] through 1965.   [birth year]  . Pneumococcal vaccine (1) 08/25/1965  . Complete foot exam   08/25/1973  . Eye exam for diabetics  08/25/1973  . HIV Screening  08/26/1978  . Tetanus Vaccine  08/26/1982  . Colon Cancer Screening  08/25/2013  . Flu Shot  01/08/2018  . Hemoglobin A1C  05/28/2018

## 2017-12-29 NOTE — Progress Notes (Signed)
   Subjective:    Patient ID: Clint BolderPhilip A Kreider, male    DOB: 1965/10/05, 52 y.o.   MRN: [id number]  HPI This is a 52 yo male who presents today for Medicare AWV  Was seen last month to establish care as his PCP, Dr. Maryellen PileEason had retired.   DM type 2- He was found to have uncontrolled DM and was restarted on oral agents because he was unable to afford injectibles. Has been taking metformin and glipizide and blood sugars have been coming down from 300s to 150s.   Pain management- has been out of meds for 3 days- glipizide, percocet, alprazolam.   Last CPE- last year, having today PSA- unknown Colonoscopy- declines Tdap- 06/26/2007 Flu- annual Dental- dentures Eye- 2-3 years ago Exercise- has not been to gym due to pain Sleep-    Review of Systems     Objective:   Physical Exam    BP (!) 162/90 (BP Location: Right Arm, Patient Position: Sitting, Cuff Size: Large)   Pulse 93   Temp (!) 97.5 F (36.4 C) (Oral)   Ht 6' 1.75" (1.873 m)   Wt 290 lb (131.5 kg)   SpO2 97%   BMI 37.49 kg/m  Wt Readings from Last 3 Encounters:  12/29/17 290 lb (131.5 kg)  11/26/17 278 lb 8 oz (126.3 kg)  07/22/15 275 lb (124.7 kg)       Assessment & Plan:

## 2018-01-05 ENCOUNTER — Encounter: Payer: Self-pay | Admitting: Family Medicine

## 2018-01-05 NOTE — Progress Notes (Signed)
Subjective:   Kevin Huff is a 52 y.o. male who presents for Medicare Annual/Subsequent preventive examination.  Review of Systems:  Out of pain meds x 3 days, increased pain, unable to do much SOB with exertion, going up stairs No chest pain No abdominal pain/nausea/vomiting/diarrhea/constipation No dysuria/hematuria, some frequency. Out of glipizide, did not realize he had refills       Objective:    Vitals: BP (!) 162/90 (BP Location: Right Arm, Patient Position: Sitting, Cuff Size: Large)   Pulse 93   Temp (!) 97.5 F (36.4 C) (Oral)   Ht 6' 1.75" (1.873 m)   Wt 290 lb (131.5 kg)   SpO2 97%   BMI 37.49 kg/m   Body mass index is 37.49 kg/m.  Advanced Directives 09/05/2015 07/22/2015  Does Patient Have a Medical Advance Directive? Yes No  Type of Advance Directive Living will -  Would patient like information on creating a medical advance directive? - No - patient declined information    Tobacco Social History   Tobacco Use  Smoking Status Never Smoker  Smokeless Tobacco Never Used     Counseling given: Not Answered   Clinical Intake:     Past Medical History:  Diagnosis Date  . Diabetes mellitus without complication (HCC)   . Hypertension    Past Surgical History:  Procedure Laterality Date  . BACK SURGERY    . KNEE SURGERY     Family History  Problem Relation Age of Onset  . Cancer Mother   . Hyperlipidemia Father   . Hypertension Father   . Diabetes Father   . Heart disease Father    Social History   Socioeconomic History  . Marital status: Married    Spouse name: Not on file  . Number of children: Not on file  . Years of education: Not on file  . Highest education level: Not on file  Occupational History  . Not on file  Social Needs  . Financial resource strain: Able to afford medications, not able to afford specialist care  . Food insecurity:    Worry: Not on file    Inability: Not on file  . Transportation needs:    Medical:  None    Non-medical: None  Tobacco Use  . Smoking status: Never Smoker  . Smokeless tobacco: Never Used  Substance and Sexual Activity  . Alcohol use: No  . Drug use: Never  . Sexual activity: Yes    Partners: Female  Lifestyle  . Physical activity:    Days per week: Limited household chores    Minutes per session: None  . Stress: Yes, obtaining medication prescriptions, pain  Relationships  . Social connections:    Talks on phone: Yes    Gets together: Limited by disability and pain    Attends religious service: Not on file    Active member of club or organization: Not on file    Attends meetings of clubs or organizations: Not on file    Relationship status: Not on file  Other Topics Concern  . Not on file  Social History Narrative  . Not on file    Outpatient Encounter Medications as of 12/29/2017  Medication Sig  . ALPRAZolam (XANAX) 0.5 MG tablet Take 1 tablet (0.5 mg total) by mouth 2 (two) times daily as needed for anxiety.  . fexofenadine (ALLEGRA) 180 MG tablet Take 180 mg by mouth daily.  Marland Kitchen. glipiZIDE (GLUCOTROL) 5 MG tablet Take 1 tablet (5 mg total) by mouth  2 (two) times daily before a meal.  . JARDIANCE 25 MG TABS tablet   . lisinopril (PRINIVIL,ZESTRIL) 20 MG tablet Take 1 tablet (20 mg total) by mouth daily.  . meloxicam (MOBIC) 15 MG tablet Take 1 tablet (15 mg total) by mouth daily.  . metFORMIN (GLUCOPHAGE) 1000 MG tablet Take 1/2 tablet with dinner x 3 days, then 1/2 twice a day for 3 days, then 1 tablet twice a day with meals  . oxyCODONE-acetaminophen (PERCOCET) 10-325 MG tablet Take 1 tablet by mouth 2 (two) times daily as needed for pain.  Nathen May SOLOSTAR 300 UNIT/ML SOPN   . traZODone (DESYREL) 100 MG tablet Take 1 tablet (100 mg total) by mouth at bedtime as needed for sleep.  . [DISCONTINUED] ALPRAZolam (XANAX) 0.5 MG tablet Take 1 tablet (0.5 mg total) by mouth 2 (two) times daily as needed for anxiety.  . [DISCONTINUED] oxyCODONE-acetaminophen  (PERCOCET) 10-325 MG tablet Take 1 tablet by mouth 2 (two) times daily as needed for pain.  . DULoxetine (CYMBALTA) 30 MG capsule Take 1 capsule (30 mg total) by mouth daily.   No facility-administered encounter medications on file as of 12/29/2017.     Activities of Daily Living No flowsheet data found.  Patient Care Team: Emi Belfast, FNP as PCP - General (Nurse Practitioner)   Assessment:   This is a routine wellness examination for Kevin Huff.  Exercise Activities and Dietary recommendations    Goals    None      Fall Risk No flowsheet data found. Is the patient's home free of loose throw rugs in walkways, pet beds, electrical cords, etc?   yes      Grab bars in the bathroom? yes      Handrails on the stairs?   yes      Adequate lighting?   yes  Timed Get Up and Go Performed: no  Depression Screen PHQ 2/9 Scores 11/26/2017  PHQ - 2 Score 2    Cognitive Function  Patient is alert and oriented x 3, able to answer questions appropriately, complete paperwork       There is no immunization history on file for this patient.- Awaiting records from prior PCP  Qualifies for Shingles Vaccine? Yes,  Unable to afford  Screening Tests Health Maintenance  Topic Date Due  . Hepatitis C Screening  12-Apr-1966  . PNEUMOCOCCAL POLYSACCHARIDE VACCINE (1) 08/25/1965  . FOOT EXAM  08/25/1973  . OPHTHALMOLOGY EXAM  08/25/1973  . HIV Screening  08/26/1978  . TETANUS/TDAP  08/26/1982  . COLONOSCOPY  08/25/2013  . INFLUENZA VACCINE  01/08/2018  . HEMOGLOBIN A1C  05/28/2018   Cancer Screenings: Lung: Low Dose CT Chest recommended if Age 28-80 years, 30 pack-year currently smoking OR have quit w/in 15years. Patient does not qualify. Colorectal: Declines colonoscopy, awaiting records from previous PCP, ? cologuard or IFOBT  Additional Screenings:  Hepatitis C Screening: will obtain at next blood draw    Hearing Screening   125Hz  250Hz  500Hz  1000Hz  2000Hz  3000Hz  4000Hz   6000Hz  8000Hz   Right ear:   20 20 20  20     Left ear:   20 20 20  20       Visual Acuity Screening   Right eye Left eye Both eyes  Without correction: 20/20 20/20 20/15   With correction:          Plan:     I have personally reviewed and noted the following in the patient's chart:   . Medical and social history .  Use of alcohol, tobacco or illicit drugs  . Current medications and supplements . Functional ability and status . Nutritional status . Physical activity . Advanced directives . List of other physicians . Hospitalizations, surgeries, and ER visits in previous 12 months . Vitals . Screenings to include cognitive, depression, and falls . Referrals and appointments  In addition, I have reviewed and discussed with patient certain preventive protocols, quality metrics, and best practice recommendations. A written personalized care plan for preventive services as well as general preventive health recommendations were provided to patient.  1. Chronic low back pain, unspecified back pain laterality, with sciatica presence unspecified - oxyCODONE-acetaminophen (PERCOCET) 10-325 MG tablet; Take 1 tablet by mouth 2 (two) times daily as needed for pain.  Dispense: 60 tablet; Refill: 0  2. Anxiety and depression - ALPRAZolam (XANAX) 0.5 MG tablet; Take 1 tablet (0.5 mg total) by mouth 2 (two) times daily as needed for anxiety.  Dispense: 60 tablet; Refill: 0 - DULoxetine (CYMBALTA) 30 MG capsule; Take 1 capsule (30 mg total) by mouth daily.  Dispense: 30 capsule; Refill: 3 - will start duloxetine for depression and chronic pain - follow up in 1 month  Olean Ree, FNP-BC  [facility] at [facility], [facility]  01/05/2018 7:47 AM      Emi Belfast, FNP  01/05/2018

## 2018-01-23 ENCOUNTER — Ambulatory Visit (INDEPENDENT_AMBULATORY_CARE_PROVIDER_SITE_OTHER): Payer: Medicare Other | Admitting: Family Medicine

## 2018-01-23 ENCOUNTER — Encounter: Payer: Self-pay | Admitting: Family Medicine

## 2018-01-23 VITALS — BP 146/98 | HR 102 | Temp 97.9°F | Ht 73.75 in | Wt 261.8 lb

## 2018-01-23 DIAGNOSIS — F329 Major depressive disorder, single episode, unspecified: Secondary | ICD-10-CM

## 2018-01-23 DIAGNOSIS — M545 Low back pain: Secondary | ICD-10-CM | POA: Diagnosis not present

## 2018-01-23 DIAGNOSIS — G8929 Other chronic pain: Secondary | ICD-10-CM

## 2018-01-23 DIAGNOSIS — H6993 Unspecified Eustachian tube disorder, bilateral: Secondary | ICD-10-CM

## 2018-01-23 DIAGNOSIS — F419 Anxiety disorder, unspecified: Secondary | ICD-10-CM | POA: Diagnosis not present
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
## 2018-01-23 MED ORDER — ALPRAZOLAM 0.5 MG PO TABS
0.5000 mg | ORAL_TABLET | Freq: Three times a day (TID) | ORAL | 0 refills | Status: DC | PRN
Start: 2018-01-23 — End: 2018-02-27

## 2018-01-23 MED ORDER — SERTRALINE HCL 50 MG PO TABS
ORAL_TABLET | ORAL | 3 refills | Status: DC
Start: 2018-01-23 — End: 2018-02-27

## 2018-01-23 MED ORDER — OXYCODONE-ACETAMINOPHEN 10-325 MG PO TABS
1.0000 | ORAL_TABLET | Freq: Three times a day (TID) | ORAL | 0 refills | Status: DC | PRN
Start: 2018-01-23 — End: 2018-02-27

## 2018-01-23 NOTE — Progress Notes (Signed)
Subjective:    Patient ID: Kevin BolderPhilip A Harnden, male    DOB: December 07, 1965, 52 y.o.   MRN: [id number]  HPI This is a 52 yo male who presents today for follow up of chronic problems and medication management.   Back pain- continues to have worsening back pain, level at a 7. Was started on duloxetine at last visit, caused him to have severe diarrhea and abdominal pain and he stopped after 1 week. Had physical therapy several years ago, little to no improvement. Has been in bed with pain, weight loss, difficulty with more daily activities/household chores. Was previously on alprazolam TID and percocet TID, when he established with me two months ago, I decreased him to BID on both. I have been unable to obtain medical records from his prior PCP because his office is closed and they are in a storage facility. Review of [facility] database showed consistent dispensing of percocet and alprazolam 90 monthly. Patient reports that he does not take meds unless he needs them. He desires to resume exercise at the gym and help out more around the house. Has tried gabapentin in past without improvement of pain. Sleep disturbed due to pain.   Increased anxiety- did not tolerate duloxetine, willing to try different daily medication, for many years solely maintained on alprazolam TID.   Left ear stopped up- since last visit. Using some Zyrtec which is working well.   Blood sugars- 150-160 fasting, highest 210 since resuming metformin.   Past Medical History:  Diagnosis Date  . Diabetes mellitus without complication (HCC)   . Hypertension    Past Surgical History:  Procedure Laterality Date  . BACK SURGERY    . KNEE SURGERY        Review of Systems Per HPI    Objective:   Physical Exam  Constitutional: He is oriented to person, place, and time. He appears well-developed and well-nourished.  Obese.   HENT:  Head: Normocephalic and atraumatic.  Right Ear: External ear normal.  Left Ear: External ear normal.    Nose: Nose normal.  Mouth/Throat: Oropharynx is clear and moist.  TMs dull.   Eyes: Conjunctivae are normal.  Cardiovascular: Normal rate, regular rhythm and normal heart sounds.  Pulmonary/Chest: Effort normal and breath sounds normal.  Musculoskeletal:  Gait with limp. Generalized decreased ROM lumbar spine, UE/LE strength 5/5, brisk radial, biceps reflexes, decreased patellar. Generalized tenderness over lumbar back. Trace pretibial edema.   Neurological: He is alert and oriented to person, place, and time.  Skin: Skin is warm and dry.  Psychiatric: His behavior is normal. Judgment and thought content normal.  Depressed mood.   Vitals reviewed.     BP (!) 146/98 (BP Location: Right Arm, Patient Position: Sitting, Cuff Size: Large)   Pulse (!) 102   Temp 97.9 F (36.6 C) (Oral)   Ht 6' 1.75" (1.873 m)   Wt 261 lb 12.8 oz (118.8 kg)   SpO2 96%   BMI 33.84 kg/m  Wt Readings from Last 3 Encounters:  01/23/18 261 lb 12.8 oz (118.8 kg)  12/29/17 290 lb (131.5 kg)  11/26/17 278 lb 8 oz (126.3 kg)       Assessment & Plan:  1. Chronic low back pain, unspecified back pain laterality, with sciatica presence unspecified - have been unable to decrease pain meds over last two months, unable to tolerate duloxetine and reports no relief with gabapentin in past - Ambulatory referral to Physical Medicine Rehab - oxyCODONE-acetaminophen (PERCOCET) 10-325 MG tablet; Take 1  tablet by mouth every 8 (eight) hours as needed for pain.  Dispense: 90 tablet; Refill: 0  2. Anxiety and depression - sertraline (ZOLOFT) 50 MG tablet; Take 1/2 tablet at night for 1 week then increase to 1 tablet nightly  Dispense: 30 tablet; Refill: 3 - ALPRAZolam (XANAX) 0.5 MG tablet; Take 1 tablet (0.5 mg total) by mouth 3 (three) times daily as needed for anxiety.  Dispense: 90 tablet; Refill: 0  3. Disorder of both eustachian tubes - continue fluticasone nasal spray and OTC antihistamines  - follow up in 1  month  Olean Reeeborah Nilo Fallin, FNP-BC  [facility], [facility]  01/28/2018 9:11 AM

## 2018-01-23 NOTE — Patient Instructions (Addendum)
Please schedule your follow up for on or after 02/26/18- we'll check your blood work then  I have sent in a prescription for sertraline for your anxiety   Stop at front desk for appointment for your back

## 2018-02-02 ENCOUNTER — Ambulatory Visit: Payer: Medicare Other | Admitting: Family Medicine
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
## 2018-02-03 ENCOUNTER — Encounter: Payer: Self-pay | Admitting: Family Medicine

## 2018-02-24 ENCOUNTER — Telehealth: Payer: Self-pay | Admitting: Physical Medicine & Rehabilitation

## 2018-02-24 NOTE — Telephone Encounter (Signed)
Returned call to ptn and he states did not receive call - I advised I notated - so unless a wrong number was dialed - I have no reason toquestion.  I advised we can do injection nonRX - [facility] and let her help with decision

## 2018-02-27 ENCOUNTER — Ambulatory Visit (INDEPENDENT_AMBULATORY_CARE_PROVIDER_SITE_OTHER): Payer: Medicare Other | Admitting: Family Medicine

## 2018-02-27 ENCOUNTER — Encounter: Payer: Self-pay | Admitting: Family Medicine

## 2018-02-27 VITALS — BP 152/88 | HR 96 | Resp 18 | Ht 73.75 in | Wt 277.0 lb

## 2018-02-27 DIAGNOSIS — F419 Anxiety disorder, unspecified: Secondary | ICD-10-CM

## 2018-02-27 DIAGNOSIS — F1190 Opioid use, unspecified, uncomplicated: Secondary | ICD-10-CM

## 2018-02-27 DIAGNOSIS — M545 Low back pain: Secondary | ICD-10-CM

## 2018-02-27 DIAGNOSIS — I10 Essential (primary) hypertension: Secondary | ICD-10-CM

## 2018-02-27 DIAGNOSIS — Z794 Long term (current) use of insulin: Secondary | ICD-10-CM

## 2018-02-27 DIAGNOSIS — G8929 Other chronic pain: Secondary | ICD-10-CM

## 2018-02-27 DIAGNOSIS — Z23 Encounter for immunization: Secondary | ICD-10-CM

## 2018-02-27 DIAGNOSIS — Z125 Encounter for screening for malignant neoplasm of prostate: Secondary | ICD-10-CM

## 2018-02-27 DIAGNOSIS — D582 Other hemoglobinopathies: Secondary | ICD-10-CM

## 2018-02-27 DIAGNOSIS — F329 Major depressive disorder, single episode, unspecified: Secondary | ICD-10-CM

## 2018-02-27 DIAGNOSIS — E1165 Type 2 diabetes mellitus with hyperglycemia: Secondary | ICD-10-CM

## 2018-02-27 DIAGNOSIS — Z1159 Encounter for screening for other viral diseases: Secondary | ICD-10-CM

## 2018-02-27 DIAGNOSIS — G4700 Insomnia, unspecified: Secondary | ICD-10-CM

## 2018-02-27 DIAGNOSIS — Z114 Encounter for screening for human immunodeficiency virus [HIV]: Secondary | ICD-10-CM

## 2018-02-27 LAB — POCT GLYCOSYLATED HEMOGLOBIN (HGB A1C): Hemoglobin A1C: 9.8 % — AB (ref 4.0–5.6)

## 2018-02-27 LAB — CBC WITH DIFFERENTIAL/PLATELET
BASOS PCT: 0.8 % (ref 0.0–3.0)
Basophils Absolute: 0 10*3/uL (ref 0.0–0.1)
EOS PCT: 1.7 % (ref 0.0–5.0)
Eosinophils Absolute: 0.1 10*3/uL (ref 0.0–0.7)
HCT: 44.6 % (ref 39.0–52.0)
HEMOGLOBIN: 15.3 g/dL (ref 13.0–17.0)
LYMPHS ABS: 1.9 10*3/uL (ref 0.7–4.0)
Lymphocytes Relative: 31.5 % (ref 12.0–46.0)
MCHC: 34.3 g/dL (ref 30.0–36.0)
MCV: 85.4 fl (ref 78.0–100.0)
MONO ABS: 0.5 10*3/uL (ref 0.1–1.0)
Monocytes Relative: 8.5 % (ref 3.0–12.0)
NEUTROS PCT: 57.5 % (ref 43.0–77.0)
Neutro Abs: 3.4 10*3/uL (ref 1.4–7.7)
Platelets: 237 10*3/uL (ref 150.0–400.0)
RBC: 5.22 Mil/uL (ref 4.22–5.81)
RDW: 14 % (ref 11.5–15.5)
WBC: 5.9 10*3/uL (ref 4.0–10.5)

## 2018-02-27 LAB — MICROALBUMIN / CREATININE URINE RATIO
Creatinine,U: 58 mg/dL
Microalb Creat Ratio: 30.3 mg/g — ABNORMAL HIGH (ref 0.0–30.0)
Microalb, Ur: 17.6 mg/dL — ABNORMAL HIGH (ref 0.0–1.9)

## 2018-02-27 LAB — PSA, MEDICARE: PSA: 0.33 ng/mL (ref 0.10–4.00)

## 2018-02-27 MED ORDER — ALPRAZOLAM 0.5 MG PO TABS: 0.5000 mg | ORAL_TABLET | Freq: Three times a day (TID) | ORAL | 0 refills | Status: DC | PRN

## 2018-02-27 MED ORDER — TRAZODONE HCL 100 MG PO TABS: 200.0000 mg | ORAL_TABLET | Freq: Every evening | ORAL | 1 refills | Status: DC | PRN

## 2018-02-27 MED ORDER — GLIPIZIDE 10 MG PO TABS: 10.0000 mg | ORAL_TABLET | Freq: Two times a day (BID) | ORAL | 1 refills | Status: DC

## 2018-02-27 MED ORDER — OXYCODONE-ACETAMINOPHEN 10-325 MG PO TABS: 1.0000 | ORAL_TABLET | Freq: Three times a day (TID) | ORAL | 0 refills | Status: DC | PRN

## 2018-02-27 NOTE — Patient Instructions (Signed)
Good to see you today  If you have not heard from Dr. Larna DaughtersKirstens office by next Wednesday, please give them a call  Keep up the good work going to the gym. Please watch your portion sizes and work on weight loss.   Follow up in 1 month

## 2018-02-27 NOTE — Progress Notes (Signed)
Subjective:    Patient ID: Kevin Huff, male    DOB: [birth year], 52 y.o.   MRN: [id number]  HPI This is a 52 yo male who presents today for follow up of DM type 2, chronic pain.   Reports that with increased meds (oxycodone, alprazolam) to 3x/ day (this is amount he was on with previous PCP), he has had decreased pain from 7 to 5 and has gone to the [facility] about 6 times in last month.   DM type2- blood sugars have been running up to 249. Will check hgba1c today. Increased appetite as he feels better. Does not watch diet.   Sleep- currently with trazadone 100 mg po qhs prescription. Per patient, he was taking 200 mg nightly. Has been alternating 100 mg every other night with 200 mg. On nights he takes 100 mg, sleeps 1.5-2 hours, on nights he takes 200 mg, sleeps 3-4 hours. Increased pain following poor sleep.   Anxiety and depression- failed trial of duloxetine (GI side effects), at last visit was prescribed sertraline. Was unable to tolerate sertraline due to severe abdominal cramps and diarrhea. Took 25 mg for 1 week but had to stop.   Prior UDS was positive for marijuana. Patient denies ever using marijuana. He reports using some locally purchased CBD oil and CBD edibles when he was out of his pain medication.   Past Medical History:  Diagnosis Date  . Diabetes mellitus without complication (HCC)   . Hypertension    Past Surgical History:  Procedure Laterality Date  . BACK SURGERY    . KNEE SURGERY     Family History  Problem Relation Age of Onset  . Cancer Mother   . Hyperlipidemia Father   . Hypertension Father   . Diabetes Father   . Heart disease Father    Social History   Tobacco Use  . Smoking status: Never Smoker  . Smokeless tobacco: Never Used  Substance Use Topics  . Alcohol use: No  . Drug use: Never      Review of Systems Per HPI    Objective:   Physical Exam  Constitutional: He appears well-developed and well-nourished. No distress.  HENT:    Head: Normocephalic and atraumatic.  Eyes: Conjunctivae are normal.  Cardiovascular: Normal rate, regular rhythm and normal heart sounds.  Pulmonary/Chest: Effort normal and breath sounds normal.  Musculoskeletal: He exhibits tenderness (paraspinal, lower back).  Neurological: He is alert.  Skin: Skin is warm and dry. He is not diaphoretic.  Psychiatric: He has a normal mood and affect. His behavior is normal. Judgment and thought content normal.  Looks more rested today, affect brighter. Less anxious.   Vitals reviewed.    BP (!) 152/88 (BP Location: Right Arm, Patient Position: Sitting, Cuff Size: Large)   Pulse 96   Resp 18   Ht 6' 1.75" (1.873 m)   Wt 277 lb (125.6 kg)   SpO2 97%   BMI 35.81 kg/m  Wt Readings from Last 3 Encounters:  02/27/18 277 lb (125.6 kg)  01/23/18 261 lb 12.8 oz (118.8 kg)  12/29/17 290 lb (131.5 kg)     Hemoglobin A1C 9.8, previous 3 months ago 11.1 Assessment & Plan:  1. Type 2 diabetes mellitus with hyperglycemia, with long-term current use of insulin (HCC) - Microalbumin / creatinine urine ratio - HgB A1c - HgB A1c slightly improved to 9.8 from 11.1, discussed results with patient, will increase glipizide to 10 mg BID, if not closer to goal in 3  months, will consider different medication - discussed decreasing portions and continued exercise  2. Essential hypertension - Microalbumin / creatinine urine ratio  3. Chronic, continuous use of opioids - Ambulatory referral to Physical Medicine Rehab - Pain Mgmt, Profile 8 w/Conf, U - patient denies ever using marijuana is agreeable to UDS today  4. Screening for prostate cancer - PSA, Medicare  5. Screening for HIV without presence of risk factors - HIV Antibody (routine testing w rflx)  6. Encounter for hepatitis C screening test for low risk patient - Hepatitis C antibody  7. Chronic low back pain, unspecified back pain laterality, with sciatica presence unspecified - Discussed seeing  Dr. Larna DaughtersKirstens in Physical Medicine Rehabilitation to see if he can offer any additional treatments - Ambulatory referral to Physical Medicine Rehab - oxyCODONE-acetaminophen (PERCOCET) 10-325 MG tablet; Take 1 tablet by mouth every 8 (eight) hours as needed for pain.  Dispense: 90 tablet; Refill: 0  8. Elevated hemoglobin (HCC) - CBC with Differential  9. Anxiety and depression - has failed recent attempts to use SNRI, SSRI, less anxious in office today - ALPRAZolam (XANAX) 0.5 MG tablet; Take 1 tablet (0.5 mg total) by mouth 3 (three) times daily as needed for anxiety.  Dispense: 90 tablet; Refill: 0  10. Insomnia, unspecified type - traZODone (DESYREL) 100 MG tablet; Take 2 tablets (200 mg total) by mouth at bedtime as needed for sleep.  Dispense: 180 tablet; Refill: 1  11. Need for influenza vaccination - Flu Vaccine QUAD 6+ mos PF IM (Fluarix Quad PF)  - he has been on monthly follow up since establishing with me. He reports that he will be out of town next month for vacation with his wife. I have advised him to call for medication refills and follow up in 2 months Olean Reeeborah Zarin Hagmann, FNP-BC  [facility]  02/27/2018 1:28 PM

## 2018-03-02 ENCOUNTER — Other Ambulatory Visit: Payer: Self-pay | Admitting: Family Medicine

## 2018-03-02 DIAGNOSIS — Z1159 Encounter for screening for other viral diseases: Secondary | ICD-10-CM

## 2018-03-05 LAB — PAIN MGMT, PROFILE 8 W/CONF, U
6 Acetylmorphine: NEGATIVE ng/mL (ref <10)
ALCOHOL METABOLITES: NEGATIVE ng/mL (ref <500)
ALPHAHYDROXYMIDAZOLAM: NEGATIVE ng/mL (ref <50)
AMINOCLONAZEPAM: NEGATIVE ng/mL (ref <25)
AMPHETAMINES: NEGATIVE ng/mL (ref <500)
Alphahydroxyalprazolam: 67 ng/mL — ABNORMAL HIGH (ref <25)
Alphahydroxytriazolam: NEGATIVE ng/mL (ref <50)
BENZODIAZEPINES: POSITIVE ng/mL — AB (ref <100)
Buprenorphine, Urine: NEGATIVE ng/mL (ref <5)
CREATININE: 54.5 mg/dL
Cocaine Metabolite: NEGATIVE ng/mL (ref <150)
Hydroxyethylflurazepam: NEGATIVE ng/mL (ref <50)
LORAZEPAM: NEGATIVE ng/mL (ref <50)
MARIJUANA METABOLITE: NEGATIVE ng/mL (ref <20)
MDMA: NEGATIVE ng/mL (ref <500)
NORDIAZEPAM: NEGATIVE ng/mL (ref <50)
NOROXYCODONE: 1050 ng/mL — AB (ref <50)
OXIDANT: NEGATIVE ug/mL (ref <200)
OXYMORPHONE: 357 ng/mL — AB (ref <50)
Opiates: NEGATIVE ng/mL (ref <100)
Oxazepam: NEGATIVE ng/mL (ref <50)
Oxycodone: 766 ng/mL — ABNORMAL HIGH (ref <50)
Oxycodone: POSITIVE ng/mL — AB (ref <100)
PH: 5.92 (ref 4.5–9.0)
TEMAZEPAM: NEGATIVE ng/mL (ref <50)

## 2018-03-05 LAB — HCV RNA,QUANTITATIVE REAL TIME PCR
HCV Quantitative Log: <1.18 Log IU/mL
HCV RNA, PCR, QN: NOT DETECTED [IU]/mL

## 2018-03-05 LAB — HIV ANTIBODY (ROUTINE TESTING W REFLEX): HIV: NONREACTIVE

## 2018-03-05 LAB — HEPATITIS C ANTIBODY
Hepatitis C Ab: REACTIVE — AB
SIGNAL TO CUT-OFF: 2.5 — ABNORMAL HIGH (ref <1.00)

## 2018-03-27 ENCOUNTER — Other Ambulatory Visit: Payer: Self-pay | Admitting: Family Medicine

## 2018-03-27 DIAGNOSIS — M545 Low back pain, unspecified: Secondary | ICD-10-CM

## 2018-03-27 DIAGNOSIS — G8929 Other chronic pain: Secondary | ICD-10-CM

## 2018-03-27 DIAGNOSIS — F329 Major depressive disorder, single episode, unspecified: Secondary | ICD-10-CM

## 2018-03-27 DIAGNOSIS — I10 Essential (primary) hypertension: Secondary | ICD-10-CM

## 2018-03-27 DIAGNOSIS — F419 Anxiety disorder, unspecified: Secondary | ICD-10-CM

## 2018-03-27 DIAGNOSIS — E1165 Type 2 diabetes mellitus with hyperglycemia: Secondary | ICD-10-CM

## 2018-03-27 DIAGNOSIS — Z794 Long term (current) use of insulin: Principal | ICD-10-CM

## 2018-03-27 NOTE — Telephone Encounter (Signed)
Controlled last filled 02/27/18 # 90 refills 0 Last OV 01/23/18

## 2018-03-31 ENCOUNTER — Other Ambulatory Visit: Payer: Self-pay | Admitting: Family Medicine

## 2018-03-31 DIAGNOSIS — E1165 Type 2 diabetes mellitus with hyperglycemia: Secondary | ICD-10-CM

## 2018-03-31 DIAGNOSIS — I10 Essential (primary) hypertension: Secondary | ICD-10-CM

## 2018-03-31 DIAGNOSIS — Z794 Long term (current) use of insulin: Principal | ICD-10-CM

## 2018-04-29 ENCOUNTER — Ambulatory Visit (INDEPENDENT_AMBULATORY_CARE_PROVIDER_SITE_OTHER): Payer: Medicare Other | Admitting: Family Medicine

## 2018-04-29 ENCOUNTER — Encounter: Payer: Self-pay | Admitting: Family Medicine

## 2018-04-29 ENCOUNTER — Telehealth: Payer: Self-pay | Admitting: Family Medicine

## 2018-04-29 ENCOUNTER — Other Ambulatory Visit: Payer: Self-pay | Admitting: Family Medicine

## 2018-04-29 VITALS — BP 144/96 | HR 86 | Temp 98.2°F | Wt 291.0 lb

## 2018-04-29 DIAGNOSIS — Z794 Long term (current) use of insulin: Principal | ICD-10-CM

## 2018-04-29 DIAGNOSIS — F419 Anxiety disorder, unspecified: Secondary | ICD-10-CM

## 2018-04-29 DIAGNOSIS — E785 Hyperlipidemia, unspecified: Secondary | ICD-10-CM

## 2018-04-29 DIAGNOSIS — F329 Major depressive disorder, single episode, unspecified: Secondary | ICD-10-CM

## 2018-04-29 DIAGNOSIS — E1165 Type 2 diabetes mellitus with hyperglycemia: Secondary | ICD-10-CM

## 2018-04-29 DIAGNOSIS — E1169 Type 2 diabetes mellitus with other specified complication: Secondary | ICD-10-CM | POA: Diagnosis not present

## 2018-04-29 DIAGNOSIS — G8929 Other chronic pain: Secondary | ICD-10-CM

## 2018-04-29 DIAGNOSIS — M545 Low back pain, unspecified: Secondary | ICD-10-CM

## 2018-04-29 DIAGNOSIS — F1190 Opioid use, unspecified, uncomplicated: Secondary | ICD-10-CM | POA: Diagnosis not present

## 2018-04-29 DIAGNOSIS — F32A Depression, unspecified: Secondary | ICD-10-CM

## 2018-04-29 MED ORDER — ALPRAZOLAM 0.5 MG PO TABS: 0.5000 mg | ORAL_TABLET | Freq: Three times a day (TID) | ORAL | 1 refills | Status: DC | PRN

## 2018-04-29 MED ORDER — OXYCODONE-ACETAMINOPHEN 10-325 MG PO TABS: 1.0000 | ORAL_TABLET | Freq: Three times a day (TID) | ORAL | 0 refills | Status: DC | PRN

## 2018-04-29 NOTE — Telephone Encounter (Signed)
Called [facility] and spoke with Print production plannerffice Manager, Wadie LessenLisa Kellner. They will not be able to accept this patient due to a phone call he made with one of their employees which he was screaming at and rude. The Pain Referral was closed. Please advise what we want to do now with the patient. He will need a New Pain Referral placed for somewhere else. They said they called him and left messages for him but they would not be giving him narcotics at all it would only be for injections and he told them he wasn't interested in injections. Please advise

## 2018-04-29 NOTE — Patient Instructions (Signed)
Good to see you today  Keep up the good work with your exercise, add stretching the day after you exercise  You will hear something about physiatrist appointment soon  Watch diet carefully- try not to gain during the holidays  Follow up in 2 months

## 2018-04-29 NOTE — Progress Notes (Signed)
Subjective:    Patient ID: Kevin Huff, male    DOB: 1965-10-15, 53 y.o.   MRN: [id number]  HPI This is a 52 yo male who presents today for management of chronic conditions- back pain, DM with hyperglycemia, anxiety.   Back pain- still trying to get an appointment with physical medicine and rehabilitation. [place] [facility] [place]. Sore on 2nd day. Pain consistently around a 5. Doing better on TID oxy-acetaminophen but did even better when on QID (discussed that this is not an option). Failed trial of duloxetine due to severe stomach upset/diarrhea. Has tried gabapentin in the past without improvement of symptoms. Had recent fall on uneven pavement and hurt right shoulder. Has been improving in pain and mobility.   DM- currently on metformin 1000 mg BID and glipizide 10 mg BID. Last hemoglobin A1c 02/27/18 was 9.8, down from 11.1 11/26/17. Blood sugars all running less than 200. Up a couple of pounds today, has been at the beach three times in last month and admits to over indulging in fried seafood. Wife tries to keep him in line with diet.   Anxiety/depression- has not tolerated sertraline or duloxetine. Continues to take alprazolam 0.5 mg TID which he has taken for years. Attempted to maintain at BID but he did not tolerate decreased frequency.     Past Medical History:  Diagnosis Date  . Diabetes mellitus without complication (HCC)   . Hypertension    Past Surgical History:  Procedure Laterality Date  . BACK SURGERY    . KNEE SURGERY     Family History  Problem Relation Age of Onset  . Cancer Mother   . Hyperlipidemia Father   . Hypertension Father   . Diabetes Father   . Heart disease Father    Social History   Tobacco Use  . Smoking status: Never Smoker  . Smokeless tobacco: Never Used  Substance Use Topics  . Alcohol use: No  . Drug use: Never      Review of Systems Per HPI    Objective:   Physical Exam  Constitutional: He is  oriented to person, place, and time. He appears well-developed and well-nourished. No distress.  HENT:  Head: Normocephalic and atraumatic.  Cardiovascular: Normal rate.  Pulmonary/Chest: Effort normal.  Musculoskeletal: He exhibits no edema.  No bony tenderness. Good ROM.   Neurological: He is alert and oriented to person, place, and time.  Skin: Skin is warm and dry. He is not diaphoretic.  Vitals reviewed.     BP (!) 144/96   Pulse 86   Temp 98.2 F (36.8 C) (Oral)   Wt 296 lb (134.3 kg)   SpO2 98%   BMI 38.26 kg/m  . Wt Readings from Last 3 Encounters:  04/29/18 291 lb (132 kg)  02/27/18 277 lb (125.6 kg)  01/23/18 261 lb 12.8 oz (118.8 kg)    Assessment & Plan:  1. Chronic low back pain - have been trying to get him in with physiatrist for second opinion, to see if he could benefit from additional interventions, will continue to work getting him an appointment - [employer] reviewed, no red flags - oxyCODONE-acetaminophen (PERCOCET) 10-325 MG tablet; Take 1 tablet by mouth every 8 (eight) hours as needed for pain.  Dispense: 90 tablet; Refill: 0 - oxyCODONE-acetaminophen (PERCOCET) 10-325 MG tablet; Take 1 tablet by mouth every 8 (eight) hours as needed for pain.  Dispense: 90 tablet; Refill: 0  2. Anxiety  and depression - has failed several medications due to GI symptoms, will continue on alprazolam for now and consider other options - ALPRAZolam (XANAX) 0.5 MG tablet; Take 1 tablet (0.5 mg total) by mouth 3 (three) times daily as needed for anxiety.  Dispense: 90 tablet; Refill: 1  3. Hyperlipidemia associated with type 2 diabetes mellitus (HCC) - has not tolerated statins in past, will try low dose and if he doesn't tolerate, every other day dosing - atorvastatin (LIPITOR) 20 MG tablet; Take 1 tablet by mouth daily, if unable to tolerate, take every other day  Dispense: 90 tablet; Refill: 3  4. Chronic continuous use of opioids - see #1   - follow up in 2  months to check labs (including hgba1c) and pain  Olean Reeeborah Deborrah Mabin, FNP-BC  [facility], [facility]  05/06/2018 7:44 AM

## 2018-05-06 ENCOUNTER — Encounter: Payer: Self-pay | Admitting: Family Medicine

## 2018-05-06 MED ORDER — ATORVASTATIN CALCIUM 20 MG PO TABS: ORAL_TABLET | ORAL | 3 refills | Status: DC

## 2018-05-06 NOTE — Progress Notes (Signed)
Agree 

## 2018-05-27 ENCOUNTER — Other Ambulatory Visit: Payer: Self-pay | Admitting: Family Medicine

## 2018-05-27 DIAGNOSIS — G8929 Other chronic pain: Secondary | ICD-10-CM

## 2018-05-27 DIAGNOSIS — M545 Low back pain, unspecified: Secondary | ICD-10-CM

## 2018-05-27 NOTE — Telephone Encounter (Signed)
Last filled  11/26/17 # 90 Refills 0  Last OV 04/29/18

## 2018-06-24 ENCOUNTER — Ambulatory Visit (INDEPENDENT_AMBULATORY_CARE_PROVIDER_SITE_OTHER): Payer: Medicare Other | Admitting: Family Medicine

## 2018-06-24 ENCOUNTER — Telehealth: Payer: Self-pay

## 2018-06-24 ENCOUNTER — Encounter: Payer: Self-pay | Admitting: Family Medicine

## 2018-06-24 VITALS — BP 158/82 | HR 88 | Temp 98.5°F | Ht 73.75 in | Wt 273.0 lb

## 2018-06-24 DIAGNOSIS — E1165 Type 2 diabetes mellitus with hyperglycemia: Secondary | ICD-10-CM

## 2018-06-24 DIAGNOSIS — E785 Hyperlipidemia, unspecified: Secondary | ICD-10-CM

## 2018-06-24 DIAGNOSIS — Z794 Long term (current) use of insulin: Secondary | ICD-10-CM

## 2018-06-24 DIAGNOSIS — M5442 Lumbago with sciatica, left side: Secondary | ICD-10-CM | POA: Diagnosis not present

## 2018-06-24 DIAGNOSIS — F419 Anxiety disorder, unspecified: Secondary | ICD-10-CM

## 2018-06-24 DIAGNOSIS — G8929 Other chronic pain: Secondary | ICD-10-CM

## 2018-06-24 DIAGNOSIS — E1169 Type 2 diabetes mellitus with other specified complication: Secondary | ICD-10-CM

## 2018-06-24 DIAGNOSIS — J069 Acute upper respiratory infection, unspecified: Secondary | ICD-10-CM

## 2018-06-24 DIAGNOSIS — B9789 Other viral agents as the cause of diseases classified elsewhere: Secondary | ICD-10-CM

## 2018-06-24 DIAGNOSIS — F329 Major depressive disorder, single episode, unspecified: Secondary | ICD-10-CM

## 2018-06-24 LAB — BASIC METABOLIC PANEL
BUN: 10 mg/dL (ref 6–23)
CO2: 26 mEq/L (ref 19–32)
Calcium: 9.3 mg/dL (ref 8.4–10.5)
Chloride: 97 mEq/L (ref 96–112)
Creatinine, Ser: 0.84 mg/dL (ref 0.40–1.50)
GFR: 100.9 mL/min (ref >60.00)
GLUCOSE: 264 mg/dL — AB (ref 70–99)
Potassium: 4.3 mEq/L (ref 3.5–5.1)
Sodium: 131 mEq/L — ABNORMAL LOW (ref 135–145)

## 2018-06-24 LAB — HEMOGLOBIN A1C: Hgb A1c MFr Bld: 10.5 % — ABNORMAL HIGH (ref 4.6–6.5)

## 2018-06-24 MED ORDER — ALPRAZOLAM 0.5 MG PO TABS: 0.5000 mg | ORAL_TABLET | Freq: Three times a day (TID) | ORAL | 2 refills | Status: DC | PRN

## 2018-06-24 MED ORDER — OXYCODONE-ACETAMINOPHEN 10-325 MG PO TABS: 1.0000 | ORAL_TABLET | Freq: Three times a day (TID) | ORAL | 0 refills | Status: DC | PRN

## 2018-06-24 NOTE — Telephone Encounter (Signed)
[facility] left v/m to verify instructions on percocet; received 3 rx and on one of them the qty was # 20 with instructions to be filled 60 days after date on rx. Please advise.

## 2018-06-24 NOTE — Progress Notes (Signed)
Subjective:    Patient ID: Kevin Huff, male    DOB: 06-26-1965, 53 y.o.   MRN: [id number]  HPI This is a 53 yo male who presents today for chronic pain management, follow up of diabetes and acute concerns.   Chronic pain- doing well on current medication regimen. Taking oxycodone-acetaminophen 10-325 TID. Without taking this, he is unable to get out of bed. Pain is not completely resolved, but this takes the edge off. He is currently exercising twice a week. No recent falls.   DM- he is on metformin, glipizide. Has been watching his diet carefully and has had some weight loss. Occasionally checks blood sugar at home and numbers rarely over 200. He tried to take atorvastatin every other day and noticed leg pain the following day. Was due to have lipids checked today.   Cough- started last night, non productive, clear runny nose, no fever, some generalized aches. No SOB or wheeze. He had lower resp infection several months ago.   Past Medical History:  Diagnosis Date  . Diabetes mellitus without complication (HCC)   . Hypertension    Past Surgical History:  Procedure Laterality Date  . BACK SURGERY    . KNEE SURGERY     Family History  Problem Relation Age of Onset  . Cancer Mother   . Hyperlipidemia Father   . Hypertension Father   . Diabetes Father   . Heart disease Father    Social History   Tobacco Use  . Smoking status: Never Smoker  . Smokeless tobacco: Never Used  Substance Use Topics  . Alcohol use: No  . Drug use: Never      Review of Systems Per HPI    Objective:   Physical Exam Vitals signs reviewed.  Constitutional:      General: He is not in acute distress.    Appearance: Normal appearance. He is obese. He is ill-appearing. He is not toxic-appearing.  HENT:     Head: Normocephalic and atraumatic.     Right Ear: Tympanic membrane and ear canal normal.     Left Ear: Tympanic membrane, ear canal and external ear normal.     Nose: Congestion and  rhinorrhea present.     Mouth/Throat:     Mouth: Mucous membranes are moist.     Pharynx: Oropharynx is clear.  Eyes:     Conjunctiva/sclera: Conjunctivae normal.  Neck:     Musculoskeletal: Normal range of motion. No neck rigidity.  Cardiovascular:     Rate and Rhythm: Normal rate and regular rhythm.     Heart sounds: Normal heart sounds.  Pulmonary:     Effort: Pulmonary effort is normal.     Breath sounds: Normal breath sounds.  Lymphadenopathy:     Cervical: No cervical adenopathy.  Skin:    General: Skin is warm and dry.  Neurological:     Mental Status: He is alert and oriented to person, place, and time.  Psychiatric:        Mood and Affect: Mood normal.        Behavior: Behavior normal.        Thought Content: Thought content normal.        Judgment: Judgment normal.      BP (!) 158/82   Pulse 88   Temp 98.5 F (36.9 C) (Oral)   Ht 6' 1.75" (1.873 m)   Wt 273 lb (123.8 kg)   SpO2 97%   BMI 35.29 kg/m  Wt Readings from Last  3 Encounters:  06/24/18 273 lb (123.8 kg)  04/29/18 291 lb (132 kg)  02/27/18 277 lb (125.6 kg)   BP Readings from Last 3 Encounters:  06/24/18 (!) 158/82  04/29/18 (!) 144/96  02/27/18 (!) 152/88        Assessment & Plan:  1. Type 2 diabetes mellitus with hyperglycemia, with long-term current use of insulin (HCC) - has lost weight, is watching diet and exercising, encouraged him to increase exercise to 3 times per week - Hemoglobin A1c - Basic Metabolic Panel  2. Hyperlipidemia associated with type 2 diabetes mellitus (HCC) - discussed taking 1/2 atorvastatin and seeing if he has improved tolerence - Basic Metabolic Panel  3. Chronic low back pain - unable to get him in with physical medicine/rehab due to conflict with their office, amb referral to pain management - Indication for chronic opioid: chronic back pain with sciatica  Medication and dose: oxycodone- acetaminophen 10-325 # pills per month: 90 Last UDS date:  02/27/18 Opioid Treatment Agreement signed (Y/N): yes Opioid Treatment Agreement last reviewed with patient:  11/26/17 [facility] reviewed this encounter (include red flags):  no red flags - oxyCODONE-acetaminophen (PERCOCET) 10-325 MG tablet; Take 1 tablet by mouth every 8 (eight) hours as needed for pain.  Dispense: 90 tablet; Refill: 0 - oxyCODONE-acetaminophen (PERCOCET) 10-325 MG tablet; Take 1 tablet by mouth every 8 (eight) hours as needed for pain.  Dispense: 90 tablet; Refill: 0 - oxyCODONE-acetaminophen (PERCOCET) 10-325 MG tablet; Take 1 tablet by mouth every 8 (eight) hours as needed for pain.  Dispense: 90 tablet; Refill: 0  4. Anxiety and depression - has not tolerated multiple attempts to start SSRI/SNRI due to side effects - ALPRAZolam (XANAX) 0.5 MG tablet; Take 1 tablet (0.5 mg total) by mouth 3 (three) times daily as needed for anxiety.  Dispense: 90 tablet; Refill: 2  5. Viral URI with cough - Provided written and verbal information regarding diagnosis and treatment. - symptomatic relief, fluids, rest, RTC precautions reviewed  - follow up in 3 months  Olean Reeeborah Isyss Espinal, FNP-BC  [facility], [facility]  06/27/2018 6:59 AM

## 2018-06-24 NOTE — Patient Instructions (Signed)
Good to see you today  Please stop at the lab, I will notify you of results in 1-3 days  For cough and phelgm, try over the counter Mucinex DM (store brand is fine)  Increase your exercise to 3 times a week  Try 1/2 tablet of atorvastatin every other day, if that goes ok, increase to daily. Take in the evening  Follow up in 3 months

## 2018-06-24 NOTE — Telephone Encounter (Signed)
Please call pharmacy and tell them that I sent in new prescription with quantity of 90.

## 2018-06-25 NOTE — Telephone Encounter (Signed)
Marchelle Folks, with [facility], left vm at triage, stating some of the fill dates listed on pts Rx are Sundays, and they are closed. Marchelle Folks states they are needing a verbal order to fill medication on the day before, Saturday, for the days they are not open an pt is due. pls advise

## 2018-06-25 NOTE — Telephone Encounter (Signed)
Marchelle Folks at [facility] notified by telephone that it is okay to fill prescriptions one day early per D. Leone Payor.

## 2018-06-25 NOTE — Telephone Encounter (Signed)
Please call patient's pharmacy and give them verbal order that it is ok to fill controlled substances one day early if the pharmacy is closed.

## 2018-06-26 ENCOUNTER — Telehealth: Payer: Self-pay | Admitting: *Deleted

## 2018-06-26 ENCOUNTER — Other Ambulatory Visit: Payer: Self-pay | Admitting: Family Medicine

## 2018-06-26 NOTE — Telephone Encounter (Signed)
Please call patient and let him know that it still sounds like a viral illness. It is ok for him to take mucinex and theraflu per package instructions. He does not want to exceed 4,000 mg of acetaminophen from all sources, including his pain medication. He can take 2 ibuprofen every 8 to 12 hours for fever if he is not taking any other anti-inflammatory medication.

## 2018-06-26 NOTE — Telephone Encounter (Signed)
Pt was seen by PCP on Wednesday and at the OV he was coughing some. He said that his PCP advise him if sxs worsen then he should call back because she would "call something in for him"   Pt called triage line and said for the last few days he's had a temp 101-102, pt has taken mucinex and Theraflu but no ibuprofen or tylenol. when I asked pt has he taken any ibuprofen or tylenol for his fever he said the mucinex and theraflu has it in there. Pt said besides the fever that's giving him chills an major fatigue his other sxs are nasal congestion, I asked pt what color is phlegm and he said he doesn't know because he isn't going to look at it, pt has a small cough but not really bad or productive and not ST. Pt said he was going to double up on the amount of mucinex and theraflu because his fever isn't breaking I requested that he only take the amount directed on the bottle until Deboraha Sprang, NP advises pt on what to do. Pt asked since he is taking mucinex and theraflu can he take ibuprofen or tylenol too.  [facility] [employer] [facility] is pharmacy

## 2018-06-26 NOTE — Telephone Encounter (Signed)
Spoke to pt

## 2018-06-27 ENCOUNTER — Encounter: Payer: Self-pay | Admitting: Family Medicine

## 2018-06-28 NOTE — Telephone Encounter (Signed)
Patient with no improvement of pain over last 6-8 weeks, new referral made to [facility].

## 2018-07-07 ENCOUNTER — Telehealth: Payer: Self-pay | Admitting: *Deleted

## 2018-07-07 NOTE — Telephone Encounter (Signed)
error 

## 2018-08-12 ENCOUNTER — Telehealth: Payer: Self-pay | Admitting: Family Medicine

## 2018-08-12 ENCOUNTER — Encounter: Payer: Self-pay | Admitting: Family Medicine

## 2018-08-12 ENCOUNTER — Ambulatory Visit (INDEPENDENT_AMBULATORY_CARE_PROVIDER_SITE_OTHER): Payer: Medicare Other | Admitting: Family Medicine

## 2018-08-12 VITALS — BP 180/98 | HR 112 | Temp 98.5°F | Ht 73.75 in | Wt 270.0 lb

## 2018-08-12 DIAGNOSIS — Z794 Long term (current) use of insulin: Secondary | ICD-10-CM

## 2018-08-12 DIAGNOSIS — M5442 Lumbago with sciatica, left side: Secondary | ICD-10-CM | POA: Diagnosis not present

## 2018-08-12 DIAGNOSIS — I10 Essential (primary) hypertension: Secondary | ICD-10-CM

## 2018-08-12 DIAGNOSIS — E1165 Type 2 diabetes mellitus with hyperglycemia: Secondary | ICD-10-CM

## 2018-08-12 DIAGNOSIS — G8929 Other chronic pain: Secondary | ICD-10-CM

## 2018-08-12 LAB — GLUCOSE, POCT (MANUAL RESULT ENTRY): POC GLUCOSE: 369 mg/dL — AB (ref 70–99)

## 2018-08-12 MED ORDER — INSULIN GLARGINE (1 UNIT DIAL) 300 UNIT/ML ~~LOC~~ SOPN: 10.0000 [IU] | PEN_INJECTOR | Freq: Every day | SUBCUTANEOUS | 11 refills | Status: DC

## 2018-08-12 MED ORDER — INSULIN GLARGINE 100 UNIT/ML SOLOSTAR PEN: 10.0000 [IU] | PEN_INJECTOR | Freq: Every day | SUBCUTANEOUS | 11 refills | Status: DC

## 2018-08-12 MED ORDER — INSULIN PEN NEEDLE 31G X 4 MM MISC: 1.0000 [IU] | Freq: Every day | 3 refills | Status: DC

## 2018-08-12 NOTE — Patient Instructions (Addendum)
I have sent in a prescription for Lantus once a day insulin, it comes in a pen. Inject 10 units at dinner or bedtime. If blood sugar is over 200, increase by 2 units each day.   Stop your glipizide  Follow up in 2 weeks, bring your blood sugar readings with you   Insulin Injection Instructions, Using Insulin Pens, Adult A subcutaneous injection is a shot of medicine that is injected into the layer of fat and tissue between skin and muscle. People with type 1 diabetes must take insulin because their bodies do not make it. People with type 2 diabetes may need to take insulin. There are many different types of insulin. The type of insulin that you take may determine how many injections you give yourself and when you need to give the injections. Supplies needed:  Soap and water to wash hands.  Your insulin pen.  A new, unused needle.  Alcohol wipes.  A disposal container that is meant for sharp items (sharps container), such as an empty plastic bottle with a cover. How to choose a site for injection The body absorbs insulin differently, depending on where the insulin is injected (injection site). It is best to inject insulin into the same body area each time (for example, always in the abdomen), but you should use a different spot in that area for each injection. Do not inject the insulin in the same spot each time. There are five main areas that can be used for injecting. These areas include:  Abdomen. This is the preferred area.  Front of thigh.  Upper, outer side of thigh.  Upper, outer side of arm.  Upper, outer part of buttock. How to use an insulin pen  First, follow the steps for Get ready, then continue with the steps for Inject the insulin. Get ready 1. Wash your hands with soap and water. If soap and water are not available, use hand sanitizer. 2. Before you give yourself an insulin injection, be sure to test your blood sugar level (blood glucose level) and write down that  number. Follow any instructions from your health care provider about what to do if your blood glucose level is higher or lower than your normal range. 3. Check the expiration date and the type of insulin that is in the pen. 4. If you are using CLEAR insulin, check to see that it is clear and free of clumps. 5. If you are using CLOUDY insulin, do not shake the pen to get the injection ready. Instead, get it ready in one of these ways: ? Gently roll the pen between your palms several times. ? Tip the pen up and down several times. 6. Remove the cap from the insulin pen. 7. Use an alcohol wipe to clean the rubber tip of the pen. 8. Remove the protective paper tab from the disposable needle. Do not let the needle touch anything. 9. Screw a new, unused needle onto the pen. 10. Remove the outer plastic needle cover. Do not throw away the outer plastic cover yet. ? If the pen uses a special safety needle, leave the inner needle shield in place. ? If the pen does not use a special safety needle, remove the inner plastic cover from the needle. 11. Follow the manufacturer's instructions to prime the insulin pen with the volume of insulin needed. Hold the pen with the needle pointing up, and push the button on the opposite end of the pen until a drop of insulin appears at  the needle tip. If no insulin appears, repeat this step. 12. Turn the button (dial) to the number of units of insulin that you will be injecting. Inject the insulin 1. Use an alcohol wipe to clean the site where you will be injecting the needle. Let the site air-dry. 2. Hold the pen in the palm of your writing hand like a pencil. 3. If directed by your health care provider, use your other hand to pinch and hold about an inch (2.5 cm) of skin at the injection site. Do not directly touch the cleaned part of the skin. 4. Gently but quickly, use your writing hand to put the needle straight into the skin. The needle should be at a 90-degree  angle (perpendicular) to the skin. 5. When the needle is completely inserted into the skin, use your thumb or index finger of your writing hand to push the top button of the pen down all the way to inject the insulin. 6. Let go of the skin that you are pinching. Continue to hold the pen in place with your writing hand. 7. Wait 10 seconds, then pull the needle straight out of the skin. This will allow all of the insulin to go from the pen and needle into your body. 8. Carefully put the larger (outer) plastic cover of the needle back over the needle, then unscrew the capped needle and discard it in a sharps container, such as an empty plastic bottle with a cover. 9. Put the plastic cap back on the insulin pen. How to throw away supplies  Discard all used needles in a puncture-proof sharps disposal container. You can ask your local pharmacy about where you can get this kind of disposal container, or you can use an empty plastic liquid laundry detergent bottle that has a cover.  Follow the disposal regulations for the area where you live. Do not use any needle more than one time.  Throw away empty disposable pens in the regular trash. Questions to ask your health care provider  How often should I be taking insulin?  How often should I check my blood glucose?  What amount of insulin should I be taking at each time?  What are the side effects?  What should I do if my blood glucose is too high?  What should I do if my blood glucose is too low?  What should I do if I forget to take my insulin?  What number should I call if I have questions? Where to find more information  American Diabetes Association (ADA): www.diabetes.org  American Association of Diabetes Educators (AADE) Patient Resources: https://www.diabeteseducator.org Summary  A subcutaneous injection is a shot of medicine that is injected into the layer of fat and tissue between skin and muscle.  Before you give yourself an  insulin injection, be sure to test your blood sugar level (blood glucose level) and write down that number.  Check the expiration date and the type of insulin that is in the pen. The type of insulin that you take may determine how many injections you give yourself and when you need to give the injections.  It is best to inject insulin into the same body area each time (for example, always in the abdomen), but you should use a different spot in that area for each injection. This information is not intended to replace advice given to you by your health care provider. Make sure you discuss any questions you have with your health care provider. Document Released:  06/30/2015 Document Revised: 06/16/2017 Document Reviewed: 06/30/2015 Elsevier Interactive Patient Education  2019 Elsevier Inc. Insulin Glargine injection What is this medicine? INSULIN GLARGINE (IN su lin GLAR geen) is a human-made form of insulin. This drug lowers the amount of sugar in your blood. It is a long-acting insulin that is usually given once a day. This medicine may be used for other purposes; ask your health care provider or pharmacist if you have questions. COMMON BRAND NAME(S): BASAGLAR, Lantus, Lantus SoloStar, Toujeo Max SoloStar, Toujeo SoloStar What should I tell my health care provider before I take this medicine? They need to know if you have any of these conditions: -episodes of low blood sugar -eye disease, vision problems -kidney disease -liver disease -an unusual or allergic reaction to insulin, metacresol, other medicines, foods, dyes, or preservatives -pregnant or trying to get pregnant -breast-feeding How should I use this medicine? This medicine is for injection under the skin. Use this medicine at the same time each day. Use exactly as directed. This insulin should never be mixed in the same syringe with other insulins before injection. Do not vigorously shake before use. You will be taught how to use this  medicine and how to adjust doses for activities and illness. Do not use more insulin than prescribed. Always check the appearance of your insulin before using it. This medicine should be clear and colorless like water. Do not use it if it is cloudy, thickened, colored, or has solid particles in it. If you use an insulin pen, be sure to take off the outer needle cover before using the dose. It is important that you put your used needles and syringes in a special sharps container. Do not put them in a trash can. If you do not have a sharps container, call your pharmacist or healthcare provider to get one. Talk to your pediatrician regarding the use of this medicine in children. Special care may be needed. Overdosage: If you think you have taken too much of this medicine contact a poison control center or emergency room at once. NOTE: This medicine is only for you. Do not share this medicine with others. What if I miss a dose? It is important not to miss a dose. Your health care professional or doctor should discuss a plan for missed doses with you. If you do miss a dose, follow their plan. Do not take double doses. What may interact with this medicine? -other medicines for diabetes Many medications may cause changes in blood sugar, these include: -alcohol containing beverages -antiviral medicines for HIV or AIDS -aspirin and aspirin-like drugs -certain medicines for blood pressure, heart disease, irregular heart beat -chromium -diuretics -male hormones, such as estrogens or progestins, birth control pills -fenofibrate -gemfibrozil -isoniazid -lanreotide -male hormones or anabolic steroids -MAOIs like Carbex, Eldepryl, Marplan, Nardil, and Parnate -medicines for weight loss -medicines for allergies, asthma, cold, or cough -medicines for depression, anxiety, or psychotic disturbances -niacin -nicotine -NSAIDs, medicines for pain and inflammation, like ibuprofen or  naproxen -octreotide -pasireotide -pentamidine -phenytoin -probenecid -quinolone antibiotics such as ciprofloxacin, levofloxacin, ofloxacin -some herbal dietary supplements -steroid medicines such as prednisone or cortisone -sulfamethoxazole; trimethoprim -thyroid hormones Some medications can hide the warning symptoms of low blood sugar (hypoglycemia). You may need to monitor your blood sugar more closely if you are taking one of these medications. These include: -beta-blockers, often used for high blood pressure or heart problems (examples include atenolol, metoprolol, propranolol) -clonidine -guanethidine -reserpine This list may not describe all possible interactions. Give  your health care provider a list of all the medicines, herbs, non-prescription drugs, or dietary supplements you use. Also tell them if you smoke, drink alcohol, or use illegal drugs. Some items may interact with your medicine. What should I watch for while using this medicine? Visit your health care professional or doctor for regular checks on your progress. Do not drive, use machinery, or do anything that needs mental alertness until you know how this medicine affects you. Alcohol may interfere with the effect of this medicine. Avoid alcoholic drinks. A test called the HbA1C (A1C) will be monitored. This is a simple blood test. It measures your blood sugar control over the last 2 to 3 months. You will receive this test every 3 to 6 months. Learn how to check your blood sugar. Learn the symptoms of low and high blood sugar and how to manage them. Always carry a quick-source of sugar with you in case you have symptoms of low blood sugar. Examples include hard sugar candy or glucose tablets. Make sure others know that you can choke if you eat or drink when you develop serious symptoms of low blood sugar, such as seizures or unconsciousness. They must get medical help at once. Tell your doctor or health care professional  if you have high blood sugar. You might need to change the dose of your medicine. If you are sick or exercising more than usual, you might need to change the dose of your medicine. Do not skip meals. Ask your doctor or health care professional if you should avoid alcohol. Many nonprescription cough and cold products contain sugar or alcohol. These can affect blood sugar. Make sure that you have the right kind of syringe for the type of insulin you use. Try not to change the brand and type of insulin or syringe unless your health care professional or doctor tells you to. Switching insulin brand or type can cause dangerously high or low blood sugar. Always keep an extra supply of insulin, syringes, and needles on hand. Use a syringe one time only. Throw away syringe and needle in a closed container to prevent accidental needle sticks. Insulin pens and cartridges should never be shared. Even if the needle is changed, sharing may result in passing of viruses like hepatitis or HIV. Each time you get a new box of pen needles, check to see if they are the same type as the ones you were trained to use. If not, ask your health care professional to show you how to use this new type properly. Wear a medical ID bracelet or chain, and carry a card that describes your disease and details of your medicine and dosage times. What side effects may I notice from receiving this medicine? Side effects that you should report to your doctor or health care professional as soon as possible: -allergic reactions like skin rash, itching or hives, swelling of the face, lips, or tongue -breathing problems -signs and symptoms of high blood sugar such as dizziness, dry mouth, dry skin, fruity breath, nausea, stomach pain, increased hunger or thirst, increased urination -signs and symptoms of low blood sugar such as feeling anxious, confusion, dizziness, increased hunger, unusually weak or tired, sweating, shakiness, cold, irritable,  headache, blurred vision, fast heartbeat, loss of consciousness Side effects that usually do not require medical attention (report to your doctor or health care professional if they continue or are bothersome): -increase or decrease in fatty tissue under the skin due to overuse of a particular injection site -  itching, burning, swelling, or rash at site where injected This list may not describe all possible side effects. Call your doctor for medical advice about side effects. You may report side effects to FDA at 1-800-FDA-1088. Where should I keep my medicine? Keep out of the reach of children. Store unopened vials in a refrigerator between 2 and 8 degrees C (36 and 46 degrees F). Do not freeze or use if the insulin has been frozen. Opened vials (vials currently in use) may be stored in the refrigerator or at room temperature, at approximately 25 degrees C (77 degrees F) or cooler. Keeping your insulin at room temperature decreases the amount of pain during injection. Once opened, your insulin can be used for 28 days. After 28 days, the vial should be thrown away. Store Lantus Dietitian in a refrigerator between 2 and 8 degrees C (36 and 46 degrees F) or at room temperature below 30 degrees C (86 degrees F). Do not freeze or use if the insulin has been frozen. Once opened, the pens should be kept at room temperature. Do not store in the refrigerator once opened. Once opened, the insulin can be used for 28 days. After 28 days, the Lantus Solostar Pen or Basaglar KwikPen should be thrown away. Store Toujeo and [employer] in a refrigerator between 2 and 8 degrees C (36 and 46 degrees F). Do not freeze or use if the insulin has been frozen. Once opened, the pens should be kept at room temperature below 30 degrees C (86 degrees F). Do not store in the refrigerator once opened. Once opened, the insulin can be used for 56 days. After 56 days, the Toujeo or [employer]  Pen should be thrown away. Protect from light and excessive heat. Throw away any unused medicine after the expiration date or after the specified time for room temperature storage has passed. NOTE: This sheet is a summary. It may not cover all possible information. If you have questions about this medicine, talk to your doctor, pharmacist, or health care provider.  2019 Elsevier/Gold Standard (2017-11-27 15:06:27)

## 2018-08-12 NOTE — Progress Notes (Signed)
Subjective:    Patient ID: Kevin Huff, male    DOB: 1965/10/18, 53 y.o.   MRN: [id number]  HPI This is a 53 yo male who presents today for follow up of hyperglycemia and chronic pain.   DM type 2- was seen 06/24/08 with hgba1c elevated to 10.5. Blood sugars running 200-300. Hasn't been able to exercise. Continues to eat poorly- breakfast- breakfast sandwich (white wheat bread, egg, bacon cheese), lunch- bologna sandwich with mayo and white wheat bread, dinner- fried pork or chicken with gravy. He has been on Toujeo in the past when his prior PCP was able to provide samples. He does not want to go back on insulin. Reports "ocd" with checking his blood sugars. He reports that he can't check too often or he gets compulsive and messes up his fingers.   Chronic back pain-  Increased pain level to 8/10, even with pain medicine (hydrocodone- acetaminophen 10-325 TID). Had uri symptoms x 3-4 weeks, now resolved. Has appointment next week with [facility] pain management. He is concerned that they will not be able to help him and it will cost him a lot of money and only suggest injections which he has had in the past and did not find helpful.   Past Medical History:  Diagnosis Date  . Diabetes mellitus without complication (HCC)   . Hypertension    Past Surgical History:  Procedure Laterality Date  . BACK SURGERY    . KNEE SURGERY     Family History  Problem Relation Age of Onset  . Cancer Mother   . Hyperlipidemia Father   . Hypertension Father   . Diabetes Father   . Heart disease Father    Social History   Tobacco Use  . Smoking status: Never Smoker  . Smokeless tobacco: Never Used  Substance Use Topics  . Alcohol use: No  . Drug use: Never      Review of Systems Per HPI    Objective:   Physical Exam Vitals signs reviewed.  Constitutional:      General: He is not in acute distress.    Appearance: He is obese. He is ill-appearing. He is not toxic-appearing or diaphoretic.    HENT:     Head: Normocephalic and atraumatic.  Cardiovascular:     Rate and Rhythm: Tachycardia present.     Heart sounds: Normal heart sounds.  Pulmonary:     Effort: Pulmonary effort is normal.     Breath sounds: Normal breath sounds.  Skin:    General: Skin is warm and dry.  Neurological:     Mental Status: He is alert and oriented to person, place, and time.  Psychiatric:        Mood and Affect: Mood normal.        Behavior: Behavior normal.        Thought Content: Thought content normal.        Judgment: Judgment normal.       BP (!) 190/130   Pulse (!) 112   Temp 98.5 F (36.9 C) (Oral)   Ht 6' 1.75" (1.873 m)   Wt 270 lb (122.5 kg)   BMI 34.90 kg/m  Wt Readings from Last 3 Encounters:  08/12/18 270 lb (122.5 kg)  06/24/18 273 lb (123.8 kg)  04/29/18 291 lb (132 kg)   BP Readings from Last 3 Encounters:  08/12/18 (!) 190/130  06/24/18 (!) 158/82  04/29/18 (!) 144/96   BP: (!) 180/98  Results for orders placed or performed in visit on 08/12/18  Glucose (CBG)  Result Value Ref Range   POC Glucose 369 (A) 70 - 99 mg/dl    Assessment & Plan:  1. Type 2 diabetes mellitus with hyperglycemia, with long-term current use of insulin (HCC) - no improvement in blood sugar with maximal oral therapy.  - discussed need to start insulin, patient upset about this, but I see no alternative since  - Glucose (CBG) - Insulin Pen Needle 31G X 4 MM MISC; 1 Units by Does not apply route daily.  Dispense: 100 each; Refill: 3 - Rx for Tujeo sent to patient's pharmacy, this was after discussion about most affordable insulin option - He was given written and verbal instructions for use, 10 units to start and increase by 2 units every night if blood sugar greater than 200.  - encouraged him to decrease bread intake, avoid frying foods, increase vegetables, lean proteins - follow up in 2 weeks, he was instructed to bring his glucometer or reading with him  2. Chronic  midline low back pain with left-sided sciatica - he is balking about seeing pain management and I told him that I had no intention of managing him on chronic opioid pain medication and I feel that his quality of life could be improved  3. Essential hypertension - elevated today, improved on recheck   Olean Ree, FNP-BC  [facility], [facility]  08/12/2018 1:58 PM

## 2018-08-12 NOTE — Telephone Encounter (Addendum)
Pt called back to find out if he can postpone pain mgt until next month so pt can afford to get his insulin today. [facility] closes at 5:30 PM and pt wants to know now Harlin Heys FNP's answer. Pt does not want cb he wants me to ask now. Pt is calm and pt notified D Gessner NP note; pt said he did not mean to raise his voice, pt apolgized and then said he did not raise his voice. Please advise. I spoke with D Leone Payor FNP and she advised for pt to get his insulin today and not cancel or reschedule pain mgt appt. Someone will call pt back about pain mgt appt. Pt voiced understanding and was very appreciative and pt apologized again if the nurse thought he was raising his voice. FYI to Harlin Heys FNP

## 2018-08-12 NOTE — Telephone Encounter (Signed)
Best number [phone]  Pt called to let you know his rx for insulin that you just prescribed  for him  His copay for this $165.  And he has another appointment next week with [facility] and copay for this is $85.  Pt cannot afford both at this time.  He would like Debbie to call him to help make a choice on which he needs to do first

## 2018-08-12 NOTE — Telephone Encounter (Signed)
Please call patient and tell him that it is imperative that he start the insulin and be seen by pain management. Both of these are necessary for his health. Please tell him that it is unacceptable for him to raise his voice with the staff and if it happens again, he will be dismissed from the practice.

## 2018-08-12 NOTE — Telephone Encounter (Signed)
Please call patient and let him know that we have been in touch with his pharmacy and Su Monks is the least expensive insulin option and this has been sent to his pharmacy. The box of insulin pen needles should last him for over 3 months.  Please tell him that as we discussed, his pain control is inadequate and I am unable to manage this and therefore it is necessary for him to see pain management for long term care for his chronic pain.

## 2018-08-12 NOTE — Telephone Encounter (Signed)
Patient notified as instructed by telephone and verbalized understanding. Patient stated that he can not do both. Patient stated that he can not afford the insulin and the pain management co-pays. Patient stated that he is on a fixed income and she (Debbie) needs to tell him which to do. Patient was upset and raised his voice stating to let him know which Eunice Blase wants him to do.

## 2018-08-13 NOTE — Telephone Encounter (Signed)
I will be discussing this with Dr. Para March on 08/14/18. If patient calls back, please tell him that someone will be in touch with him in the next couple of days.

## 2018-08-14 NOTE — Telephone Encounter (Signed)
Patient called pain management to cancel appointment and the office closed at noon.  Patient said he'll call them back on Monday morning to try and reschedule the appointment.

## 2018-08-14 NOTE — Telephone Encounter (Signed)
Noted  

## 2018-08-14 NOTE — Telephone Encounter (Signed)
Please call the patient and let him know that in order to take care of his health, it is necessary that he take his insulin and be seen by pain management to better control his chronic pain. I am unwilling to prescribe any more pain medication for him. If he is unable to go along with these recommendations, it will be necessary for him to find another provider.

## 2018-08-14 NOTE — Telephone Encounter (Signed)
Patient notified as instructed by telephone and verbalized understanding. Patient stated that he thought that our office was going to cancel his pain management appointment for Monday and get it rescheduled for next month so that he would have the money for the co-pay  After talking with Eunice Blase advised patient that he needs to call the pain management clinic and cancel the appointment and get it rescheduled. Patent stated that he does not have the money for the appointment Monday. Advised patient when he calls to cancel the appointment to see if they have any type of payment plan that they can work out for him.  Patient stated that he  did not cancel the appointment sooner because he was waiting on a call back after his conversation with the triage nurse. Patient stated that he will call now to cancel and reschedule the appointment. Advised patient again that Eunice Blase will not continue to refill his pain medication and he verbalized understanding.

## 2018-08-16 ENCOUNTER — Encounter: Payer: Self-pay | Admitting: Family Medicine

## 2018-08-18 ENCOUNTER — Ambulatory Visit: Payer: Medicare Other | Admitting: Student in an Organized Health Care Education/Training Program

## 2018-08-24 ENCOUNTER — Ambulatory Visit: Payer: Medicare Other | Admitting: Family Medicine

## 2018-08-31 ENCOUNTER — Encounter: Payer: Self-pay | Admitting: Family Medicine

## 2018-08-31 ENCOUNTER — Ambulatory Visit (INDEPENDENT_AMBULATORY_CARE_PROVIDER_SITE_OTHER): Payer: Medicare Other | Admitting: Family Medicine

## 2018-08-31 ENCOUNTER — Other Ambulatory Visit: Payer: Self-pay

## 2018-08-31 VITALS — BP 150/80 | HR 75 | Temp 98.3°F | Ht 73.0 in | Wt 291.0 lb

## 2018-08-31 DIAGNOSIS — G4700 Insomnia, unspecified: Secondary | ICD-10-CM | POA: Diagnosis not present

## 2018-08-31 DIAGNOSIS — I10 Essential (primary) hypertension: Secondary | ICD-10-CM

## 2018-08-31 DIAGNOSIS — Z794 Long term (current) use of insulin: Secondary | ICD-10-CM | POA: Diagnosis not present

## 2018-08-31 DIAGNOSIS — E1165 Type 2 diabetes mellitus with hyperglycemia: Secondary | ICD-10-CM | POA: Diagnosis not present

## 2018-08-31 MED ORDER — TRAZODONE HCL 100 MG PO TABS: 200.0000 mg | ORAL_TABLET | Freq: Every evening | ORAL | 1 refills | Status: DC | PRN

## 2018-08-31 NOTE — Progress Notes (Signed)
   Subjective:    Patient ID: Kevin Huff, male    DOB: [birth year], 53 y.o.   MRN: [id number]  HPI This is a 53 yo male who presents today for follow up of diabetes and recent insulin start. He was seen 08/12/18 with continued elevated blood sugar readings. He was started on Toujeo Insulin nightly. He is currently taking 20 units an night. He has his meter with him today. A review of readings shows they are running from 128- 312. Two readings over 300. He can not tell if he feels any differently. This morning his blood sugar is 243. Has stopped eating bread. Has been walking around his block once a day since gym is closed.   Past Medical History:  Diagnosis Date  . Diabetes mellitus without complication (HCC)   . Hypertension    Past Surgical History:  Procedure Laterality Date  . BACK SURGERY    . KNEE SURGERY     Family History  Problem Relation Age of Onset  . Cancer Mother   . Hyperlipidemia Father   . Hypertension Father   . Diabetes Father   . Heart disease Father    Social History   Tobacco Use  . Smoking status: Never Smoker  . Smokeless tobacco: Never Used  Substance Use Topics  . Alcohol use: No  . Drug use: Never      Review of Systems Per HPI    Objective:   Physical Exam Vitals signs reviewed.  Constitutional:      General: He is not in acute distress.    Appearance: He is obese. He is not ill-appearing, toxic-appearing or diaphoretic.  HENT:     Head: Normocephalic and atraumatic.  Cardiovascular:     Rate and Rhythm: Normal rate.  Pulmonary:     Effort: Pulmonary effort is normal.  Neurological:     Mental Status: He is alert and oriented to person, place, and time.  Psychiatric:     Comments: Appears less anxious than at visit 2 weeks ago.       BP (!) 160/78   Pulse 75   Temp 98.3 F (36.8 C)   Ht 6\' 1"  (1.854 m)   Wt 291 lb (132 kg)   SpO2 97%   BMI 38.39 kg/m  Wt Readings from Last 3 Encounters:  08/31/18 291 lb (132 kg)-  rechecked, not sure if accurate, patient stated he was 270 at home this morning  08/12/18 270 lb (122.5 kg)  06/24/18 273 lb (123.8 kg)   BP: (!) 150/80         Assessment & Plan:  1. Type 2 diabetes mellitus with hyperglycemia, with long-term current use of insulin (HCC) - improved readings, continue Toujeo, encouraged continued healthier food choices, daily exercise - follow up in 2 months, sooner if needed or if blood sugars do not continue to decrease  2. Insomnia, unspecified type - requests refill of trazadone for sleep - traZODone (DESYREL) 100 MG tablet; Take 2 tablets (200 mg total) by mouth at bedtime as needed for sleep.  Dispense: 180 tablet; Refill: 1  3. Essential hypertension - improved on recheck, may need to increase lisinopril   Olean Ree, FNP-BC  [facility], [facility]  08/31/2018 4:08 PM

## 2018-08-31 NOTE — Patient Instructions (Addendum)
Good to see you today  Please follow up in 2 months for a diabetes check- call if you need anything sooner

## 2018-09-28 ENCOUNTER — Ambulatory Visit: Payer: Medicare Other | Admitting: Family Medicine

## 2018-09-28 ENCOUNTER — Other Ambulatory Visit: Payer: Self-pay | Admitting: Family Medicine

## 2018-09-28 DIAGNOSIS — G8929 Other chronic pain: Secondary | ICD-10-CM

## 2018-09-28 DIAGNOSIS — F329 Major depressive disorder, single episode, unspecified: Secondary | ICD-10-CM

## 2018-09-28 DIAGNOSIS — Z794 Long term (current) use of insulin: Secondary | ICD-10-CM

## 2018-09-28 DIAGNOSIS — F419 Anxiety disorder, unspecified: Principal | ICD-10-CM

## 2018-09-28 DIAGNOSIS — E1165 Type 2 diabetes mellitus with hyperglycemia: Secondary | ICD-10-CM

## 2018-09-28 DIAGNOSIS — M5442 Lumbago with sciatica, left side: Principal | ICD-10-CM

## 2018-09-28 NOTE — Telephone Encounter (Signed)
Alprazolam last filled 08-28-18 #[phone] Last OV 08-31-18 Next OV 10-28-18 [facility]

## 2018-09-29 ENCOUNTER — Other Ambulatory Visit: Payer: Self-pay | Admitting: Family Medicine

## 2018-09-29 DIAGNOSIS — F329 Major depressive disorder, single episode, unspecified: Secondary | ICD-10-CM

## 2018-09-29 DIAGNOSIS — F419 Anxiety disorder, unspecified: Principal | ICD-10-CM

## 2018-09-29 MED ORDER — OXYCODONE-ACETAMINOPHEN 10-325 MG PO TABS: 1.0000 | ORAL_TABLET | Freq: Three times a day (TID) | ORAL | 0 refills | Status: DC | PRN

## 2018-09-29 NOTE — Telephone Encounter (Signed)
Last office visit 08/31/2018 for DM.  Last refilled 06/24/2018 x 3 months.  UDS/Contract 11/26/2017.  Next Appt: 10/28/2018.

## 2018-10-07 NOTE — Progress Notes (Signed)
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 laminectomy and posterior lumbar fusion from L5-S1.  This was an incomplete fusion patient states that he was not interested in having corrective surgery given what he had experienced with his first operation.  Patient was previously seen at this pain clinic many years ago from 2011-2013 and saw my colleague Dr. Dossie Huff.  Under the care of Dr. Dossie Huff, various interventional modalities were trialed including lumbar epidural steroid injections, caudal ESI, lumbar facet medial branch nerve blocks none of which were very effective the patient notes.  Patient also had a [employer] spinal cord stimulator placed in 2013 for postlaminectomy pain syndrome and failed back surgical syndrome.  Patient states that it was effective for 2 years and then afterward to stop working.  Patient has tried physical therapy in the past and he states that this was not very effective.  He is  currently disabled and he states that his wife who is 44 years older than him is retired and that he has difficulty performing ADLs.  Patient's primary care provider has been managing his medications.  He is on Percocet 10 mg 3 times daily, Mobic 15 mg daily and also takes trazodone at night for sleep.  Patient is also on Xanax 0.5 mg 3 times daily as needed to manage his anxiety.  Patient is also a type II diabetic on insulin with an A1c of 10.5.  Patient is primarily being referred for discussion of any new interventional management options.  The patient has tried various interventional modalities in the past including epidural steroid injections, lumbar facet medial branch nerve blocks as well as trigger point injections, none of which were effective in managing his pain.  Furthermore the patient is also status post spinal cord stimulator placement however he states that after the first 2 years, it no longer provided him with any sort of pain relief.  At this time, we are limited on interventional options.  There is no new interventional modality to help with the patient's chronic lumbar radicular pain and postlaminectomy pain syndrome.  I informed the patient that a component of his pain could be stemming from the fact that his lumbar spinal fusion is incomplete however the patient is not interested in having repeat surgery to get this corrected.  Furthermore, patient's A1c is 10.5 and I advised against any diagnostic or therapeutic procedures that involve steroid therapy as this could worsen the patient's A1c.  I also had an extensive discussion with the patient that it is imperative for him to get better control of his blood sugars because high A1c's can promote nerve damage and result in neuropathic pain symptoms which are only further compounding what he is experiencing from his lumbar spinal fusion and chronic radicular pain.  I informed the patient that if there are any procedures or interventions that  do become FDA approved for failed back surgical syndrome other than lumbar spinal cord stimulation, I would reach back out to him.  In regards to medication management, that is to continue with the patient's primary care provider.  He will not be a candidate for  chronic opioid therapy at this clinic given concomitant use of Xanax with Percocet.  I informed the patient of the risk of respiratory depression that can happen with both of these medications and told him to be cautious with there intake.  Patient also denied any previous history of drug use but does have urine drug screens (at least 2), the most recent one being in 11/2017, positive for [facility] however his repeat UDS on 02/2018 was negative . Patient will not be a candidate for chronic opioid therapy at this clinic.   I will certainly reach out to him if there are any interventional therapies that do become available that I think could benefit him.  Follow-up with PCP.  No follow-up needed at pain clinic.  Provider-requested follow-up: No follow-ups on file.    Future Appointments  Date Time Provider [place]  10/28/2018  8:00 AM Kevin Beck, FNP [facility]    Total duration of non-face-to-face encounter: 30 minutes.  Primary Care Physician: Kevin Beck, FNP Location: [facility] Note by: Kevin Santa, MD Date: 10/13/2018; Time: 12:55 PM

## 2018-10-12 ENCOUNTER — Encounter: Payer: Self-pay | Admitting: Student in an Organized Health Care Education/Training Program

## 2018-10-13 ENCOUNTER — Ambulatory Visit
Payer: Medicare Other | Attending: Student in an Organized Health Care Education/Training Program | Admitting: Student in an Organized Health Care Education/Training Program

## 2018-10-13 ENCOUNTER — Other Ambulatory Visit: Payer: Self-pay

## 2018-10-13 DIAGNOSIS — M961 Postlaminectomy syndrome, not elsewhere classified: Secondary | ICD-10-CM

## 2018-10-13 DIAGNOSIS — Z6836 Body mass index (BMI) 36.0-36.9, adult: Secondary | ICD-10-CM

## 2018-10-13 DIAGNOSIS — G8929 Other chronic pain: Secondary | ICD-10-CM

## 2018-10-13 DIAGNOSIS — E1165 Type 2 diabetes mellitus with hyperglycemia: Secondary | ICD-10-CM

## 2018-10-13 DIAGNOSIS — F1190 Opioid use, unspecified, uncomplicated: Secondary | ICD-10-CM

## 2018-10-13 DIAGNOSIS — Z794 Long term (current) use of insulin: Secondary | ICD-10-CM

## 2018-10-13 DIAGNOSIS — T85192S Other mechanical complication of implanted electronic neurostimulator (electrode) of spinal cord, sequela: Secondary | ICD-10-CM | POA: Diagnosis not present

## 2018-10-13 DIAGNOSIS — M5416 Radiculopathy, lumbar region: Secondary | ICD-10-CM

## 2018-10-13 DIAGNOSIS — G894 Chronic pain syndrome: Secondary | ICD-10-CM

## 2018-10-13 DIAGNOSIS — Z981 Arthrodesis status: Secondary | ICD-10-CM

## 2018-10-28 ENCOUNTER — Encounter: Payer: Self-pay | Admitting: Family Medicine

## 2018-10-28 ENCOUNTER — Other Ambulatory Visit: Payer: Self-pay

## 2018-10-28 ENCOUNTER — Ambulatory Visit (INDEPENDENT_AMBULATORY_CARE_PROVIDER_SITE_OTHER): Payer: Medicare Other | Admitting: Family Medicine

## 2018-10-28 VITALS — BP 142/80 | HR 86 | Temp 98.1°F | Resp 22 | Ht 73.0 in | Wt 293.5 lb

## 2018-10-28 DIAGNOSIS — I10 Essential (primary) hypertension: Secondary | ICD-10-CM | POA: Diagnosis not present

## 2018-10-28 DIAGNOSIS — Z794 Long term (current) use of insulin: Secondary | ICD-10-CM | POA: Diagnosis not present

## 2018-10-28 DIAGNOSIS — F419 Anxiety disorder, unspecified: Secondary | ICD-10-CM

## 2018-10-28 DIAGNOSIS — E1165 Type 2 diabetes mellitus with hyperglycemia: Secondary | ICD-10-CM | POA: Diagnosis not present

## 2018-10-28 DIAGNOSIS — F329 Major depressive disorder, single episode, unspecified: Secondary | ICD-10-CM

## 2018-10-28 DIAGNOSIS — M5442 Lumbago with sciatica, left side: Secondary | ICD-10-CM

## 2018-10-28 DIAGNOSIS — M961 Postlaminectomy syndrome, not elsewhere classified: Secondary | ICD-10-CM

## 2018-10-28 DIAGNOSIS — F32A Depression, unspecified: Secondary | ICD-10-CM

## 2018-10-28 DIAGNOSIS — G8929 Other chronic pain: Secondary | ICD-10-CM

## 2018-10-28 LAB — POCT GLYCOSYLATED HEMOGLOBIN (HGB A1C): Hemoglobin A1C: 9.9 % — AB (ref 4.0–5.6)

## 2018-10-28 MED ORDER — METFORMIN HCL 1000 MG PO TABS: 1000.0000 mg | ORAL_TABLET | Freq: Two times a day (BID) | ORAL | 3 refills | Status: DC

## 2018-10-28 MED ORDER — MELOXICAM 15 MG PO TABS: 15.0000 mg | ORAL_TABLET | Freq: Every day | ORAL | 1 refills | Status: DC

## 2018-10-28 MED ORDER — ALPRAZOLAM 0.5 MG PO TABS: ORAL_TABLET | ORAL | 0 refills | Status: DC

## 2018-10-28 MED ORDER — OXYCODONE-ACETAMINOPHEN 10-325 MG PO TABS: 1.0000 | ORAL_TABLET | Freq: Three times a day (TID) | ORAL | 0 refills | Status: DC | PRN

## 2018-10-28 MED ORDER — LISINOPRIL 20 MG PO TABS: 20.0000 mg | ORAL_TABLET | Freq: Every day | ORAL | 3 refills | Status: DC

## 2018-10-28 NOTE — Progress Notes (Signed)
Subjective:    Patient ID: Kevin Huff, male    DOB: 1965/06/20, 53 y.o.   MRN: [id number]  HPI This is a 53 yo male who presents today for follow up of DM type 2. Was seen 08/12/2018 with elevated blood sugar and started on Tujeo. He followed up several weeks later with some improvement of home glucose readings. He presents today for follow up and recheck of Hgba1c. Taking 20 units Tojeuo at dinner, metformin 1000 mg twice a day. He did not bring in his meter today but states that blood sugars running 150-180, this am 240. He ate a sandwich around 9 pm last night. According to the patient, he is watching diet more carefully, eating smaller portions and limiting bread to a couple of times a week.   Pain has improved since last visit. Today is a "good" day for him. Usually requires pain medication 3 times a day. Sometimes only needs to take 2. Anxiety is about the same. He had a consultation with pain management, Dr. Cherylann Ratel on 10/13/2018. It was felt that he may have an incomplete lumbar spinal fusion but the patient is not interested in surgery. He is not a candidate for injections due to elevated blood sugars. Patient was not deemed a candidate for opioid treatment at their clinic due to concomitant use of alprazolam and positive drug screen in past for THC.    Past Medical History:  Diagnosis Date  . Diabetes mellitus without complication (HCC)   . Hypertension    Past Surgical History:  Procedure Laterality Date  . BACK SURGERY    . KNEE SURGERY     Family History  Problem Relation Age of Onset  . Cancer Mother   . Hyperlipidemia Father   . Hypertension Father   . Diabetes Father   . Heart disease Father    Social History   Tobacco Use  . Smoking status: Never Smoker  . Smokeless tobacco: Never Used  Substance Use Topics  . Alcohol use: No  . Drug use: Never      Review of Systems Per HPI     Objective:   Physical Exam Vitals signs reviewed.  Constitutional:    Appearance: Normal appearance. He is obese.  HENT:     Head: Normocephalic.  Eyes:     Conjunctiva/sclera: Conjunctivae normal.  Cardiovascular:     Rate and Rhythm: Normal rate.  Pulmonary:     Effort: Pulmonary effort is normal.  Musculoskeletal:     Comments: Slow, wide based gait.    Neurological:     Mental Status: He is alert and oriented to person, place, and time.  Psychiatric:        Mood and Affect: Mood normal.        Behavior: Behavior normal.        Thought Content: Thought content normal.        Judgment: Judgment normal.      BP (!) 142/80   Pulse 86   Temp 98.1 F (36.7 C)   Resp (!) 22   Ht 6\' 1"  (1.854 m)   Wt 293 lb 8 oz (133.1 kg)   SpO2 99%   BMI 38.72 kg/m  Wt Readings from Last 3 Encounters:  10/28/18 293 lb 8 oz (133.1 kg)  08/31/18 291 lb (132 kg)  08/12/18 270 lb (122.5 kg)    BP Readings from Last 3 Encounters:  10/28/18 (!) 142/80  08/31/18 (!) 150/80  08/12/18 (!) 180/98   Results  for orders placed or performed in visit on 10/28/18  HgB A1c  Result Value Ref Range   Hemoglobin A1C 9.9 (A) 4.0 - 5.6 %   HbA1c POC (<> result, manual entry)     HbA1c, POC (prediabetic range)     HbA1c, POC (controlled diabetic range)         Assessment & Plan:  1. Type 2 diabetes mellitus with hyperglycemia, with long-term current use of insulin (HCC) - slightly improved from 10.5 to 9.9 - reviewed increasing insulin and provided written instructions and indications to follow up - follow up in 2 months, discussed diet, he is to bring his glucometer with hiim - HgB A1c - metFORMIN (GLUCOPHAGE) 1000 MG tablet; Take 1 tablet (1,000 mg total) by mouth 2 (two) times daily with a meal.  Dispense: 180 tablet; Refill: 3 - lisinopril (ZESTRIL) 20 MG tablet; Take 1 tablet (20 mg total) by mouth daily.  Dispense: 90 tablet; Refill: 3  2. Anxiety and depression - somewhat improved - ALPRAZolam (XANAX) 0.5 MG tablet; TAKE 1 TABLET BY MOUTH 3 TIMES DAILY AS  NEEDED FOR ANXIETY  Dispense: 90 tablet; Refill: 0  3. Lumbar post-laminectomy syndrome - meloxicam (MOBIC) 15 MG tablet; Take 1 tablet (15 mg total) by mouth daily.  Dispense: 90 tablet; Refill: 1  4. Essential hypertension - lisinopril (ZESTRIL) 20 MG tablet; Take 1 tablet (20 mg total) by mouth daily.  Dispense: 90 tablet; Refill: 3  5. Chronic midline low back pain with left-sided sciatica - oxyCODONE-acetaminophen (PERCOCET) 10-325 MG tablet; Take 1 tablet by mouth every 8 (eight) hours as needed for pain.  Dispense: 90 tablet; Refill: 0 - oxyCODONE-acetaminophen (PERCOCET) 10-325 MG tablet; Take 1 tablet by mouth every 8 (eight) hours as needed for pain.  Dispense: 90 tablet; Refill: 0  - follow up in 2 months for labs/pain management visit.  Olean Reeeborah Gessner, FNP-BC  [facility], [facility]  10/28/2018 9:58 AM

## 2018-10-28 NOTE — Patient Instructions (Addendum)
Blood sugars looking better.   Increase your Toujeo by 2 units if your blood sugar is over 200. For example, if you are taking 20 units and blood sugar is 204, increase your insulin to 22 units. Stay at this dose unless you need to increase because sugars are above 200.   If you get to 30 units, call me.   Follow up in 2 months for pain management appointment and blood work.

## 2018-11-30 ENCOUNTER — Other Ambulatory Visit: Payer: Self-pay | Admitting: Family Medicine

## 2018-11-30 DIAGNOSIS — F419 Anxiety disorder, unspecified: Secondary | ICD-10-CM

## 2018-11-30 DIAGNOSIS — F329 Major depressive disorder, single episode, unspecified: Secondary | ICD-10-CM

## 2018-11-30 NOTE — Telephone Encounter (Signed)
Last filled 10-28-18 #90 Last OV 10-28-18 Next OV 12-28-18 [facility]

## 2018-12-28 ENCOUNTER — Other Ambulatory Visit: Payer: Self-pay | Admitting: Family Medicine

## 2018-12-28 ENCOUNTER — Encounter: Payer: Self-pay | Admitting: Family Medicine

## 2018-12-28 ENCOUNTER — Ambulatory Visit (INDEPENDENT_AMBULATORY_CARE_PROVIDER_SITE_OTHER): Payer: Medicare Other | Admitting: Family Medicine

## 2018-12-28 DIAGNOSIS — M5442 Lumbago with sciatica, left side: Secondary | ICD-10-CM

## 2018-12-28 DIAGNOSIS — G8929 Other chronic pain: Secondary | ICD-10-CM

## 2018-12-28 DIAGNOSIS — F329 Major depressive disorder, single episode, unspecified: Secondary | ICD-10-CM

## 2018-12-28 DIAGNOSIS — E1165 Type 2 diabetes mellitus with hyperglycemia: Secondary | ICD-10-CM

## 2018-12-28 DIAGNOSIS — E1169 Type 2 diabetes mellitus with other specified complication: Secondary | ICD-10-CM

## 2018-12-28 DIAGNOSIS — E785 Hyperlipidemia, unspecified: Secondary | ICD-10-CM

## 2018-12-28 DIAGNOSIS — F419 Anxiety disorder, unspecified: Secondary | ICD-10-CM

## 2018-12-28 DIAGNOSIS — Z794 Long term (current) use of insulin: Secondary | ICD-10-CM

## 2018-12-28 DIAGNOSIS — I10 Essential (primary) hypertension: Secondary | ICD-10-CM

## 2018-12-28 DIAGNOSIS — M961 Postlaminectomy syndrome, not elsewhere classified: Secondary | ICD-10-CM

## 2018-12-28 MED ORDER — ALPRAZOLAM 0.5 MG PO TABS: ORAL_TABLET | ORAL | 0 refills | Status: DC

## 2018-12-28 MED ORDER — ATORVASTATIN CALCIUM 20 MG PO TABS: ORAL_TABLET | ORAL | 3 refills | Status: DC

## 2018-12-28 MED ORDER — OXYCODONE-ACETAMINOPHEN 10-325 MG PO TABS: 1.0000 | ORAL_TABLET | Freq: Three times a day (TID) | ORAL | 0 refills | Status: DC | PRN

## 2018-12-28 NOTE — Patient Instructions (Signed)
Hi [place],  Good to see you for your video visit today.   I'm glad your blood sugars are improved- keep up the good work with portion control, eating mostly lean proteins, non starchy vegetables. Limit bread, snack foods, fried foods, pasta.   Keep up the walking as much as you can, go early or late when heat isn't as bad.   Follow up in 2 months. Please bring your blood sugar meter to the visit.   Warm regards,  Tor Netters, FNP-BC

## 2018-12-28 NOTE — Progress Notes (Signed)
Virtual Visit via Video Note  I connected with Clint BolderPhilip A Vespa on 12/28/18 at  9:00 AM EDT by a video enabled telemedicine application and verified that I am speaking with the correct person using two identifiers.  Location: Patient: in his [place] [facility]   I discussed the limitations of evaluation and management by telemedicine and the availability of in person appointments. The patient expressed understanding and agreed to proceed.  History of Present Illness: This is a 53 yo male who presents today for virtual visit for 2 month follow up of DM type 2 on insulin and chronic pain.   DM type 2- He is currently on metformin 1000 mg bid, insulin glargine 25 units qhs. He reports that his blood sugars are improved and have ranged from 70-207 with average 120-160. This am was 186. He has noticed a loss of weight and decreased polyuria. He is trying to be more mindful of his portions.   Chronic pain- continues to be maintained on percocet 10-325, tid prn. He does better with warm weather and has been trying to walk 1-2 times a day.   Observations/Objective: The patient is alert and answers questions appropriately. Visible skin is unremarkable. He is conversive without SOB, audible wheeze or witnessed cough. His mood and affect are appropriate.    BP 135/84   Pulse 95   Ht 6\' 1"  (1.854 m)   Wt 282 lb (127.9 kg)   BMI 37.21 kg/m  Wt Readings from Last 3 Encounters:  12/28/18 282 lb (127.9 kg)  10/28/18 293 lb 8 oz (133.1 kg)  08/31/18 291 lb (132 kg)    Assessment and Plan: 1. Anxiety and depression - ALPRAZolam (XANAX) 0.5 MG tablet; TAKE 1 TABLET BY MOUTH 3 TIMES A DAY AS NEEDED FOR ANXIETY  Dispense: 90 tablet; Refill: 0  2. Chronic midline low back pain with left-sided sciatica - Indication for chronic opioid: chronic back pain, post laminectomy syndrome Medication and dose: oxycodone-acetaminophen 10-325, TID prn # pills per month: 90 Last UDS date:  02/27/2018 Opioid Treatment Agreement signed (Y/N): Y Opioid Treatment Agreement last reviewed with patient:  11/2017 [facility] reviewed this encounter (include red flags): Yes  - oxyCODONE-acetaminophen (PERCOCET) 10-325 MG tablet; Take 1 tablet by mouth every 8 (eight) hours as needed for pain.  Dispense: 90 tablet; Refill: 0 - oxyCODONE-acetaminophen (PERCOCET) 10-325 MG tablet; Take 1 tablet by mouth every 8 (eight) hours as needed for pain.  Dispense: 90 tablet; Refill: 0  3. Type 2 diabetes mellitus with hyperglycemia, with long-term current use of insulin (HCC) - improved symptoms, blood sugar and weight loss - follow up in 8 weeks for HgbA1c  4. Essential hypertension - well controlled  5. Hyperlipidemia associated with type 2 diabetes mellitus (HCC) - will check lipid with next lab draw - atorvastatin (LIPITOR) 20 MG tablet; Take 1 tablet by mouth daily, if unable to tolerate, take every other day  Dispense: 90 tablet; Refill: 3  6. Lumbar post-laminectomy syndrome - oxyCODONE-acetaminophen (PERCOCET) 10-325 MG tablet; Take 1 tablet by mouth every 8 (eight) hours as needed for pain.  Dispense: 90 tablet; Refill: 0 - oxyCODONE-acetaminophen (PERCOCET) 10-325 MG tablet; Take 1 tablet by mouth every 8 (eight) hours as needed for pain.  Dispense: 90 tablet; Refill: 0   Olean Reeeborah Gessner, FNP-BC  [facility], [facility]  12/28/2018 9:34 AM   Follow Up Instructions:    I discussed the assessment and treatment plan with the  patient. The patient was provided an opportunity to ask questions and all were answered. The patient agreed with the plan and demonstrated an understanding of the instructions.   The patient was advised to call back or seek an in-person evaluation if the symptoms worsen or if the condition fails to improve as anticipated.   Elby Beck, FNP

## 2019-01-28 ENCOUNTER — Other Ambulatory Visit: Payer: Self-pay | Admitting: Family Medicine

## 2019-01-28 DIAGNOSIS — F32A Depression, unspecified: Secondary | ICD-10-CM

## 2019-01-28 DIAGNOSIS — F329 Major depressive disorder, single episode, unspecified: Secondary | ICD-10-CM

## 2019-01-29 ENCOUNTER — Other Ambulatory Visit: Payer: Self-pay | Admitting: Family Medicine

## 2019-01-29 DIAGNOSIS — F419 Anxiety disorder, unspecified: Secondary | ICD-10-CM

## 2019-01-29 DIAGNOSIS — F329 Major depressive disorder, single episode, unspecified: Secondary | ICD-10-CM

## 2019-01-29 DIAGNOSIS — F32A Depression, unspecified: Secondary | ICD-10-CM

## 2019-01-29 NOTE — Telephone Encounter (Signed)
Last seen and refilled 12/28/2018 #90 x 0 refills Next Appt With Family Medicine Kevin Beck, FNP) 03/01/2019 at 9:00 AM  Please advise

## 2019-01-29 NOTE — Telephone Encounter (Signed)
Last OV virtual 12/28/2018, filled at this OV Next Appt With Family Medicine Elby Beck, FNP) 03/01/2019 at 9:00 AM

## 2019-02-01 ENCOUNTER — Other Ambulatory Visit: Payer: Self-pay | Admitting: Family Medicine

## 2019-02-01 DIAGNOSIS — F419 Anxiety disorder, unspecified: Secondary | ICD-10-CM

## 2019-02-01 DIAGNOSIS — F329 Major depressive disorder, single episode, unspecified: Secondary | ICD-10-CM

## 2019-02-02 NOTE — Telephone Encounter (Addendum)
RX was denied 01/29/19  by Jackelyn Poling -- see previous Rx encounters

## 2019-02-02 NOTE — Telephone Encounter (Signed)
This Rx was denied by Jackelyn Poling on 01/29/2019 -- see previous Refill Encounters

## 2019-03-01 ENCOUNTER — Ambulatory Visit: Payer: Self-pay | Admitting: Family Medicine

## 2019-03-01 ENCOUNTER — Encounter: Payer: Self-pay | Admitting: Family Medicine

## 2019-03-01 ENCOUNTER — Other Ambulatory Visit: Payer: Self-pay

## 2019-03-01 ENCOUNTER — Ambulatory Visit (INDEPENDENT_AMBULATORY_CARE_PROVIDER_SITE_OTHER): Payer: Medicare Other | Admitting: Family Medicine

## 2019-03-01 VITALS — BP 132/84 | HR 74 | Temp 98.2°F | Ht 73.0 in | Wt 281.3 lb

## 2019-03-01 DIAGNOSIS — Z794 Long term (current) use of insulin: Secondary | ICD-10-CM | POA: Diagnosis not present

## 2019-03-01 DIAGNOSIS — H5789 Other specified disorders of eye and adnexa: Secondary | ICD-10-CM

## 2019-03-01 DIAGNOSIS — Z2821 Immunization not carried out because of patient refusal: Secondary | ICD-10-CM

## 2019-03-01 DIAGNOSIS — M961 Postlaminectomy syndrome, not elsewhere classified: Secondary | ICD-10-CM

## 2019-03-01 DIAGNOSIS — Z23 Encounter for immunization: Secondary | ICD-10-CM

## 2019-03-01 DIAGNOSIS — E1165 Type 2 diabetes mellitus with hyperglycemia: Secondary | ICD-10-CM

## 2019-03-01 DIAGNOSIS — F329 Major depressive disorder, single episode, unspecified: Secondary | ICD-10-CM

## 2019-03-01 DIAGNOSIS — G4700 Insomnia, unspecified: Secondary | ICD-10-CM

## 2019-03-01 DIAGNOSIS — G8929 Other chronic pain: Secondary | ICD-10-CM

## 2019-03-01 DIAGNOSIS — F1190 Opioid use, unspecified, uncomplicated: Secondary | ICD-10-CM | POA: Diagnosis not present

## 2019-03-01 DIAGNOSIS — M5442 Lumbago with sciatica, left side: Secondary | ICD-10-CM

## 2019-03-01 DIAGNOSIS — F419 Anxiety disorder, unspecified: Secondary | ICD-10-CM | POA: Diagnosis not present

## 2019-03-01 DIAGNOSIS — F32A Depression, unspecified: Secondary | ICD-10-CM

## 2019-03-01 LAB — LIPID PANEL
Cholesterol: 192 mg/dL (ref 0–200)
HDL: 33.4 mg/dL — ABNORMAL LOW (ref >39.00)
NonHDL: 158.86
Total CHOL/HDL Ratio: 6
Triglycerides: 222 mg/dL — ABNORMAL HIGH (ref 0.0–149.0)
VLDL: 44.4 mg/dL — ABNORMAL HIGH (ref 0.0–40.0)

## 2019-03-01 LAB — LDL CHOLESTEROL, DIRECT: Direct LDL: 142 mg/dL

## 2019-03-01 LAB — CBC WITH DIFFERENTIAL/PLATELET
Basophils Absolute: 0.1 10*3/uL (ref 0.0–0.1)
Basophils Relative: 0.7 % (ref 0.0–3.0)
Eosinophils Absolute: 0.1 10*3/uL (ref 0.0–0.7)
Eosinophils Relative: 0.4 % (ref 0.0–5.0)
HCT: 49 % (ref 39.0–52.0)
Hemoglobin: 16.5 g/dL (ref 13.0–17.0)
Lymphocytes Relative: 16.4 % (ref 12.0–46.0)
Lymphs Abs: 2.1 10*3/uL (ref 0.7–4.0)
MCHC: 33.7 g/dL (ref 30.0–36.0)
MCV: 87.4 fl (ref 78.0–100.0)
Monocytes Absolute: 0.9 10*3/uL (ref 0.1–1.0)
Monocytes Relative: 7 % (ref 3.0–12.0)
Neutro Abs: 9.6 10*3/uL — ABNORMAL HIGH (ref 1.4–7.7)
Neutrophils Relative %: 75.5 % (ref 43.0–77.0)
Platelets: 270 10*3/uL (ref 150.0–400.0)
RBC: 5.61 Mil/uL (ref 4.22–5.81)
RDW: 13.8 % (ref 11.5–15.5)
WBC: 12.7 10*3/uL — ABNORMAL HIGH (ref 4.0–10.5)

## 2019-03-01 LAB — POCT GLYCOSYLATED HEMOGLOBIN (HGB A1C): Hemoglobin A1C: 10.7 % — AB (ref 4.0–5.6)

## 2019-03-01 LAB — MICROALBUMIN / CREATININE URINE RATIO
Creatinine,U: 16.6 mg/dL
Microalb Creat Ratio: 63.9 mg/g — ABNORMAL HIGH (ref 0.0–30.0)
Microalb, Ur: 10.6 mg/dL — ABNORMAL HIGH (ref 0.0–1.9)

## 2019-03-01 MED ORDER — OXYCODONE-ACETAMINOPHEN 10-325 MG PO TABS: 1.0000 | ORAL_TABLET | Freq: Three times a day (TID) | ORAL | 0 refills | Status: DC | PRN

## 2019-03-01 MED ORDER — ALPRAZOLAM 0.5 MG PO TABS: ORAL_TABLET | ORAL | 1 refills | Status: DC

## 2019-03-01 MED ORDER — CIPROFLOXACIN HCL 0.3 % OP SOLN: 2.0000 [drp] | Freq: Three times a day (TID) | OPHTHALMIC | 0 refills | Status: DC

## 2019-03-01 MED ORDER — GLIPIZIDE 10 MG PO TABS: ORAL_TABLET | ORAL | 1 refills | Status: DC

## 2019-03-01 MED ORDER — TRAZODONE HCL 100 MG PO TABS: 200.0000 mg | ORAL_TABLET | Freq: Every evening | ORAL | 1 refills | Status: DC | PRN

## 2019-03-01 NOTE — Progress Notes (Signed)
Controlled substance contract.

## 2019-03-01 NOTE — Patient Instructions (Addendum)
Call your mail order pharmacy. Ask them how much it will cost to get your insulin through mail order for 90 day supply. Let me know if you want me to send to [facility].   Look at your carbohydrate counts-  Take total carbohydrates and subtract fiber- aim for 20 grams per meal. Bean serving is 1/2 cup.   One protein (meat, fish, eggs, greek yogurt- unsweetened), one starch (beans, starchy vegetables), lots of non starchy veggies and a lower sugar fruit (berries, 1/2 banana, apples, pear, orange, grapes) per meal.   Follow up in 3 months. Aim to keep your blood sugars less than 200.

## 2019-03-01 NOTE — Progress Notes (Signed)
Subjective:    Patient ID: Kevin Huff, male    DOB: 09/17/1965, 53 y.o.   MRN: [id number]  HPI This is a 53 yo male who presents today for follow up of chronic medical conditions.   Chronic back pain on opioid pain medication- maintained on oxycodone-acetaminophen 1--325 TID with pain around 5/10 on average. This morning, he reports it is at a 7. His gym has reopened and he plans to go back to [place] and swimming.   DM type 2 on insulin- blood sugar readings 70- 300. Taking 22 units Tujeo. Breakfast- eggs, cheese, meat. Usually no lunch. Dinner is meat, salad, can of beans. Will sometimes have meat and loaded potato. Enjoys melons, pineapple.   HTN- blood pressure consistently elevated when he is first checked in clinic and has lowered to normal on recheck. Continue lisinopril 20 mg.   Anxiety- he reports that he has been doing well the last two months. He has been fishing and has annual beach trip with his wife coming up.   He declines pneumonia and flu, stating he will get at his pharmacy.   Past Medical History:  Diagnosis Date  . Chronic back pain greater than 3 months duration   . Depression   . Diabetes mellitus without complication ([place])   . Hypertension    Past Surgical History:  Procedure Laterality Date  . BACK SURGERY    . KNEE SURGERY     Family History  Problem Relation Age of Onset  . Cancer Mother   . Hyperlipidemia Father   . Hypertension Father   . Diabetes Father   . Heart disease Father    Social History   Tobacco Use  . Smoking status: Never Smoker  . Smokeless tobacco: Never Used  Substance Use Topics  . Alcohol use: No  . Drug use: Never      Review of Systems     Objective:   Physical Exam    BP (!) 168/84   Pulse (!) 119   Temp 98.2 F (36.8 C)   Ht 6\' 1"  (1.854 m)   Wt 281 lb 5 oz (127.6 kg)   SpO2 98%   BMI 37.11 kg/m  Wt Readings from Last 3 Encounters:  03/01/19 281 lb 5 oz (127.6 kg)  12/28/18 282  lb (127.9 kg)  10/28/18 293 lb 8 oz (133.1 kg)   Results for orders placed or performed in visit on 03/01/19  HgB A1c  Result Value Ref Range   Hemoglobin A1C 10.7 (A) 4.0 - 5.6 %   HbA1c POC (<> result, manual entry)     HbA1c, POC (prediabetic range)     HbA1c, POC (controlled diabetic range)     Diabetic Foot Exam - Simple   Simple Foot Huff Diabetic Foot exam was performed with the following findings: Yes 03/01/2019 10:00 AM  Visual Inspection See comments: Yes Sensation Testing See comments: Yes Pulse Check Posterior Tibialis and Dorsalis pulse intact bilaterally: Yes Comments Bilateral feet very dry with superficial cracking.  Left foot without sensation to monofilament. Right normal.  Bilateral feet warm and dry without swelling.         Assessment & Plan:  1. Chronic, continuous use of opioids Indication for chronic opioid: chronic back pain Medication and dose: oxycodone-acetaminophen 10-325 TID # pills per month: 90 Last UDS date: due this month, collected today Opioid Treatment Agreement signed (Y/N): Y- renewed today Opioid Treatment Agreement last reviewed with patient:   today Olga  reviewed this encounter (include red flags): Yes, no red flags - Pain Mgmt, Profile 8 w/Conf, U  2. Type 2 diabetes mellitus with hyperglycemia, with long-term current use of insulin (HCC) - hgbaic increased today, discussed increasing his insulin, his pharmacy benefit will be decreasing as he gets into the "donut hole," and he tells me that he is unable to afford to increase his insulin. He wishes to work on his diet and exercise and we had a prolonged discussion about limiting his carbohydrate intake significantly and increasing his protein, non starchy vegetables, lower sugar fruits. Discussed goal of all blood sugar readings below 200. He declines referral to nutrition.  - HgB A1c - Lipid Panel - Microalbumin / creatinine urine ratio - CBC with Differential - glipiZIDE  (GLUCOTROL) 10 MG tablet; TAKE 1 TABLET BY MOUTH TWICE A DAY BEFORE A MEAL  Dispense: 180 tablet; Refill: 1 - Ambulatory referral to Ophthalmology  3. Anxiety and depression - encouraged him to get back to the gym at least 4 days a week - ALPRAZolam (XANAX) 0.5 MG tablet; TAKE 1 TABLET BY MOUTH 3 TIMES A DAY AS NEEDED FOR ANXIETY  Dispense: 90 tablet; Refill: 1  4. Chronic midline low back pain with left-sided sciatica - oxyCODONE-acetaminophen (PERCOCET) 10-325 MG tablet; Take 1 tablet by mouth every 8 (eight) hours as needed for pain.  Dispense: 90 tablet; Refill: 0 - oxyCODONE-acetaminophen (PERCOCET) 10-325 MG tablet; Take 1 tablet by mouth every 8 (eight) hours as needed for pain.  Dispense: 90 tablet; Refill: 0 - oxyCODONE-acetaminophen (PERCOCET) 10-325 MG tablet; Take 1 tablet by mouth every 8 (eight) hours as needed for pain.  Dispense: 90 tablet; Refill: 0  5. Insomnia, unspecified type - traZODone (DESYREL) 100 MG tablet; Take 2 tablets (200 mg total) by mouth at bedtime as needed for sleep.  Dispense: 180 tablet; Refill: 1  6. Lumbar post-laminectomy syndrome - oxyCODONE-acetaminophen (PERCOCET) 10-325 MG tablet; Take 1 tablet by mouth every 8 (eight) hours as needed for pain.  Dispense: 90 tablet; Refill: 0 - oxyCODONE-acetaminophen (PERCOCET) 10-325 MG tablet; Take 1 tablet by mouth every 8 (eight) hours as needed for pain.  Dispense: 90 tablet; Refill: 0  7. Redness of left eye - has old cipro ophthalmic solution bottle, I have refilled and asked him to discuss with ophthalmology at upcoming appointment - ciprofloxacin (CILOXAN) 0.3 % ophthalmic solution; Place 2 drops into both eyes 3 (three) times daily.  Dispense: 2.5 mL; Refill: 0  8. Influenza vaccination declined - he states he will get at his pharmacy  9. Need for vaccination against Streptococcus pneumoniae using pneumococcal conjugate vaccine 7 - states he will get at his pharmacy  Over 45 minutes were spent  face-to-face with the patient during this encounter and >50% of that time was spent on counseling and coordination of care  Olean Reeeborah Gessner, FNP-BC  [facility]  03/01/2019 10:03 AM

## 2019-03-01 NOTE — Progress Notes (Signed)
Agree 

## 2019-03-03 ENCOUNTER — Other Ambulatory Visit: Payer: Self-pay | Admitting: Family Medicine

## 2019-03-03 DIAGNOSIS — G8929 Other chronic pain: Secondary | ICD-10-CM

## 2019-03-03 DIAGNOSIS — M961 Postlaminectomy syndrome, not elsewhere classified: Secondary | ICD-10-CM

## 2019-03-03 LAB — PAIN MGMT, PROFILE 8 W/CONF, U
6 Acetylmorphine: NEGATIVE ng/mL
Alcohol Metabolites: NEGATIVE ng/mL (ref <500)
Alphahydroxyalprazolam: NEGATIVE ng/mL
Alphahydroxymidazolam: NEGATIVE ng/mL
Alphahydroxytriazolam: NEGATIVE ng/mL
Aminoclonazepam: NEGATIVE ng/mL
Amphetamines: NEGATIVE ng/mL
Benzodiazepines: NEGATIVE ng/mL
Buprenorphine, Urine: NEGATIVE ng/mL
Cocaine Metabolite: NEGATIVE ng/mL
Codeine: NEGATIVE ng/mL
Creatinine: 16.4 mg/dL
Hydrocodone: NEGATIVE ng/mL
Hydromorphone: NEGATIVE ng/mL
Hydroxyethylflurazepam: NEGATIVE ng/mL
Lorazepam: NEGATIVE ng/mL
MDMA: NEGATIVE ng/mL
Marijuana Metabolite: NEGATIVE ng/mL
Morphine: NEGATIVE ng/mL
Nordiazepam: NEGATIVE ng/mL
Norhydrocodone: NEGATIVE ng/mL
Noroxycodone: 296 ng/mL
Opiates: NEGATIVE ng/mL
Oxazepam: NEGATIVE ng/mL
Oxidant: NEGATIVE ug/mL
Oxycodone: 187 ng/mL
Oxycodone: POSITIVE ng/mL
Oxymorphone: 98 ng/mL
Specific Gravity: 1.009 (ref >=1.0)
Temazepam: NEGATIVE ng/mL
pH: 5.3 (ref 4.5–9.0)

## 2019-03-03 MED ORDER — OXYCODONE-ACETAMINOPHEN 10-325 MG PO TABS: 1.0000 | ORAL_TABLET | Freq: Three times a day (TID) | ORAL | 0 refills | Status: DC | PRN

## 2019-03-03 NOTE — Progress Notes (Signed)
Oxycodone prescriptions resent per pharmacy

## 2019-03-04 ENCOUNTER — Telehealth: Payer: Self-pay

## 2019-03-04 NOTE — Telephone Encounter (Signed)
Brie with [facility] left v/m requesting cb about oxycodone apap rx. I spoke with Brie and she said the one prescription has fill 30 days from the date of prescription and the other prescription has fill 60 days from date of rx. I advised on med list the 2 prescriptions sent electronically on 03/03/19 one should have under note to pharmacy do not fill until 03/31/19 and the other oxycodone apap 10-325 mg should have under note to pharmacy do not fill until 04/26/19. Brie voiced understanding and appreciative. Nothing further needed.

## 2019-03-08 ENCOUNTER — Ambulatory Visit: Payer: Medicare Other

## 2019-03-15 ENCOUNTER — Encounter: Payer: Medicare Other | Admitting: Family Medicine

## 2019-03-15 ENCOUNTER — Ambulatory Visit: Payer: Medicare Other

## 2019-05-01 ENCOUNTER — Other Ambulatory Visit: Payer: Self-pay | Admitting: Family Medicine

## 2019-05-01 DIAGNOSIS — F419 Anxiety disorder, unspecified: Secondary | ICD-10-CM

## 2019-05-01 DIAGNOSIS — F329 Major depressive disorder, single episode, unspecified: Secondary | ICD-10-CM

## 2019-05-03 NOTE — Telephone Encounter (Signed)
Next Appt With Family Medicine Elby Beck, FNP) 06/30/2019 at 8:00 AM Last refilled 03/01/2019 #90 x 1 refill  Please advise, thanks.

## 2019-05-31 ENCOUNTER — Other Ambulatory Visit: Payer: Self-pay | Admitting: Family Medicine

## 2019-05-31 DIAGNOSIS — M961 Postlaminectomy syndrome, not elsewhere classified: Secondary | ICD-10-CM

## 2019-06-01 ENCOUNTER — Other Ambulatory Visit: Payer: Self-pay | Admitting: Family Medicine

## 2019-06-01 DIAGNOSIS — G8929 Other chronic pain: Secondary | ICD-10-CM

## 2019-06-01 DIAGNOSIS — M5442 Lumbago with sciatica, left side: Secondary | ICD-10-CM

## 2019-06-01 DIAGNOSIS — M961 Postlaminectomy syndrome, not elsewhere classified: Secondary | ICD-10-CM

## 2019-06-01 NOTE — Telephone Encounter (Signed)
Last OV for pain management 03/01/2019 Last refill 03/01/2019 #90 x 0 refills -- Oxycodone Last refill 10/2018 #90 x 1 refill -- Meloxicam  Next Appt With Family Medicine Elby Beck, FNP) 06/30/2019 at 8:00 AM  Please advise, thanks.

## 2019-06-02 ENCOUNTER — Other Ambulatory Visit: Payer: Self-pay | Admitting: Family Medicine

## 2019-06-02 DIAGNOSIS — M961 Postlaminectomy syndrome, not elsewhere classified: Secondary | ICD-10-CM

## 2019-06-02 NOTE — Telephone Encounter (Signed)
Pt calling again requesting this be refilled asap

## 2019-06-02 NOTE — Telephone Encounter (Signed)
This is a duplicate request but the encounter will not allow me to refuse these Rxs. Will hold to follow up once the other encounter is closed.

## 2019-06-02 NOTE — Telephone Encounter (Signed)
Last office visit 03/01/2019 for Chronic use of opioids.  Last refilled 03/01/2019 x 3 months.  UDS/Contract 03/01/2019.  Next Appt: 06/30/2019 for 3 month follow up.

## 2019-06-02 NOTE — Telephone Encounter (Signed)
Jackelyn Poling can you please refuse these refills.  It is a duplicate but will not let us deny them.

## 2019-06-02 NOTE — Telephone Encounter (Signed)
Please call patient and remind him that he is required to have every 3 months visits for chronic pain management. He is overdue a visit, that is why he did not have refills of his medication. I have sent in a 15 day supply and will not provide any additional refills until he has a follow up visit.

## 2019-06-02 NOTE — Telephone Encounter (Signed)
Kevin Huff,    See Debbie's note below.  Can you try and move up his appointment for pain management for sometime in the next 15 days.

## 2019-06-03 NOTE — Telephone Encounter (Signed)
Spoke to pt regarding changing appointment.  He stated right due to his schedule he could change appointment from 1/20. Offered pt virtual appointment he wanted me to check with debbie to see if this is ok due to him having lab work every 20months

## 2019-06-07 NOTE — Telephone Encounter (Signed)
Please call patient and let him know that he is due lab work for his diabetes. Lab work and virtual visit can be done separately.

## 2019-06-09 NOTE — Telephone Encounter (Signed)
Left message asking pt to call office  °

## 2019-06-09 NOTE — Telephone Encounter (Signed)
Patient returned call  Advised him of the message from [place]. Patient did not understand, Even after explaining that his appt and lab could be separate. And could do an appointment sooner than the 20th since we can do it virtual. So he did not run out of medication since he will run out before the 20th. Then schedule a time for him to come for the follow up labs. Patient did not understand what I was explaining to him and requested a call from the nurse .

## 2019-06-09 NOTE — Telephone Encounter (Addendum)
Spoke with patient, he was very rude and argumentitive regarding his appt and medication refills.  Pt states that he is going to run out of his medication before his follow up appt 06/30/2019 with Debbie. Pt states that he does not understand why on 06/02/19 he was only given a 15 day supply of the Oxycodone - I explained that she only refilled 15 days because he is due for his pain medication follow up and his appt needs to be moved up sooner than 1/20. The patient states that he was told by someone that initially answered the phone that we would take care of this and refill the medication for him -- I apologized that this is the case for Narcotics and that the front should not be guaranteeing the refill of any narcotics d/t there being a protocol to follow. The patient that very upset that he was told this was going to be taken care of and now I am telling him (per Jackelyn Poling) that he needs an appt in order to have this refilled.  I tried to reschedule him for next week around 06/15/2018 and he argued that this will not work and that he does not have the time and this is not his fault. Pt was offered several times to schedule a virtual visit if in person would not work for him. He kept saying that the reason he is going to run out is on [place] not him - I explained that this is incorrect, I am trying to help him avoid a lapse in medication by rescheduling him sooner with Jackelyn Poling so that this can be refilled and he is refusing. Pt argued for about 5 minutes straight that we are not working with him and that he cannot do any appt next week and now he will run out and again not his fault. I apologized again for the inconvenience and misinformation given by the front (if this actually took place, not documented) but that there is a protocol to follow per [place] Controlled Substance Laws and that this is not Jackelyn Poling just refusing to refill - we are trying to help him and are offering him appointments and at this time it is his choice  what he wants to do... He can wait until 06/30/2019 and run out of his medication early OR work out an earlier appt with Debbie next week(in person or virtual) and get his medication refilled. The patient stated he did not know what to do and then went silent on the phone. When I tried speak again to give him the option again he started repeating himself and talking over me again. I explained that our conversation has now ended and there is nothing more to say and that he can think about his options and call back. The patient then stated "Happy New Year" and hung up the phone.   Will send to [place] as FYI as well as Leticia Penna d/t the type of conversation that took place.

## 2019-06-16 ENCOUNTER — Ambulatory Visit (INDEPENDENT_AMBULATORY_CARE_PROVIDER_SITE_OTHER): Payer: Medicare Other | Admitting: Family Medicine

## 2019-06-16 ENCOUNTER — Encounter: Payer: Self-pay | Admitting: Family Medicine

## 2019-06-16 ENCOUNTER — Other Ambulatory Visit: Payer: Self-pay

## 2019-06-16 VITALS — Ht 73.0 in | Wt 282.0 lb

## 2019-06-16 DIAGNOSIS — E1165 Type 2 diabetes mellitus with hyperglycemia: Secondary | ICD-10-CM | POA: Diagnosis not present

## 2019-06-16 DIAGNOSIS — M961 Postlaminectomy syndrome, not elsewhere classified: Secondary | ICD-10-CM

## 2019-06-16 DIAGNOSIS — F1190 Opioid use, unspecified, uncomplicated: Secondary | ICD-10-CM | POA: Diagnosis not present

## 2019-06-16 DIAGNOSIS — G8929 Other chronic pain: Secondary | ICD-10-CM

## 2019-06-16 DIAGNOSIS — Z794 Long term (current) use of insulin: Secondary | ICD-10-CM

## 2019-06-16 DIAGNOSIS — M5442 Lumbago with sciatica, left side: Secondary | ICD-10-CM

## 2019-06-16 MED ORDER — OXYCODONE-ACETAMINOPHEN 10-325 MG PO TABS: 1.0000 | ORAL_TABLET | Freq: Three times a day (TID) | ORAL | 0 refills | Status: DC | PRN

## 2019-06-16 MED ORDER — ONETOUCH VERIO VI STRP: ORAL_STRIP | 12 refills | Status: DC

## 2019-06-16 MED ORDER — ONETOUCH VERIO W/DEVICE KIT: 1.0000 [IU] | PACK | Freq: Three times a day (TID) | 0 refills | Status: DC

## 2019-06-16 NOTE — Progress Notes (Signed)
Virtual Visit via Video Note  I connected with Kevin Huff on 06/16/19 at  9:30 AM EST by a video enabled telemedicine application and verified that I am speaking with the correct person using two identifiers.  Location: Patient: In his home Provider: [place] Persons participating in virtual visit: Patient and provider   I discussed the limitations of evaluation and management by telemedicine and the availability of in person appointments. The patient expressed understanding and agreed to proceed.  History of Present Illness: Chief Complaint  Patient presents with  . chronic pain management    Oxycodone refills - pt states that he is using them every 8 hours as prescribed.   This is a 54 year old male who presents for virtual visit today for chronic pain medication management as well as chronic disease management.  Chronic pain-he reports using oxycodone 10-3 25 every 8 hours which "takes the edge off my pain."  He reports that the winter is typically worse for him with cold and damp weather.  He has been unable to do pool exercises due to pandemic but his pool is reopening soon and he is hoping to be able to resume exercise at least a couple of times a week.  He denies any constipation, excessive sleepiness with pain medication.  Diabetes mellitus-poorly controlled.  Last hemoglobin A1c 3 months ago was 10.7, up from 9.9.  Toujeo was added to his Metformin and glipizide.  He has been taking 10 units.  He was instructed to increase for blood sugars over 200 but it does not sound as though he has been doing this.  Blood sugars are consistently running over 200.  Needs follow-up hemoglobin A1c.  He reports that he has decreased his starchy and fried foods but still struggles to eat lower carbohydrate diet.  Anxiety-he has failed multiple attempts to get him on SSRI/SNRI.  He is currently maintained on alprazolam 0.5 mg 3 times daily as needed.  He reports increased stress recently  with his truck being out of service.  Past Medical History:  Diagnosis Date  . Chronic back pain greater than 3 months duration   . Depression   . Diabetes mellitus without complication ([place])   . Hypertension    Past Surgical History:  Procedure Laterality Date  . BACK SURGERY    . KNEE SURGERY     Family History  Problem Relation Age of Onset  . Cancer Mother   . Hyperlipidemia Father   . Hypertension Father   . Diabetes Father   . Heart disease Father    Social History   Tobacco Use  . Smoking status: Never Smoker  . Smokeless tobacco: Never Used  Substance Use Topics  . Alcohol use: No  . Drug use: Never      Observations/Objective: Patient is obese.  He is alert and answers questions appropriately.  Visible skin is unremarkable.  He is normally conversive without shortness of breath, audible wheeze or witnessed cough.  Mood and affect are typical for patient. Ht 6' 1"  (1.854 m)   Wt 282 lb (127.9 kg)   BMI 37.21 kg/m  Wt Readings from Last 3 Encounters:  06/16/19 282 lb (127.9 kg)  03/01/19 281 lb 5 oz (127.6 kg)  12/28/18 282 lb (127.9 kg)    Assessment and Plan: 1. Chronic, continuous use of opioids Indication for chronic opioid: Chronic midline low back pain, lumbar postlaminectomy syndrome Medication and dose: Oxycodone-acetaminophen 10-325 mg every 8 hours as needed # pills per month:  90 Last UDS date: 03/01/2019 Opioid Treatment Agreement signed (Y/N): Y Opioid Treatment Agreement last reviewed with patient:   NCCSRS reviewed this encounter (include red flags): Yes   2. Chronic midline low back pain with left-sided sciatica -Encouraged him to resume pool exercises as this has helped in the past - oxyCODONE-acetaminophen (PERCOCET) 10-325 MG tablet; Take 1 tablet by mouth every 8 (eight) hours as needed for pain.  Dispense: 90 tablet; Refill: 0 - oxyCODONE-acetaminophen (PERCOCET) 10-325 MG tablet; Take 1 tablet by mouth every 8 (eight) hours as  needed for pain.  Dispense: 90 tablet; Refill: 0 - oxyCODONE-acetaminophen (PERCOCET) 10-325 MG tablet; Take 1 tablet by mouth every 8 (eight) hours as needed for pain.  Dispense: 90 tablet; Refill: 0  3. Lumbar post-laminectomy syndrome - oxyCODONE-acetaminophen (PERCOCET) 10-325 MG tablet; Take 1 tablet by mouth every 8 (eight) hours as needed for pain.  Dispense: 90 tablet; Refill: 0 - oxyCODONE-acetaminophen (PERCOCET) 10-325 MG tablet; Take 1 tablet by mouth every 8 (eight) hours as needed for pain.  Dispense: 90 tablet; Refill: 0  4. Type 2 diabetes mellitus with hyperglycemia, with long-term current use of insulin ([place]) - Labs needed, he will schedule lab only visit - Blood Glucose Monitoring Suppl (ONETOUCH VERIO) w/Device KIT; 1 Units by Does not apply route 3 (three) times daily.  Dispense: 1 kit; Refill: 0 - glucose blood (ONETOUCH VERIO) test strip; Use as instructed  Dispense: 100 each; Refill: 12 - Comprehensive metabolic panel; Future - Hemoglobin A1c; Future  - follow up in 3 months  Clarene Reamer, FNP-BC  [facility]  06/18/2019 10:01 AM   Follow Up Instructions:    I discussed the assessment and treatment plan with the patient. The patient was provided an opportunity to ask questions and all were answered. The patient agreed with the plan and demonstrated an understanding of the instructions.   The patient was advised to call back or seek an in-person evaluation if the symptoms worsen or if the condition fails to improve as anticipated.   Elby Beck, FNP

## 2019-06-16 NOTE — Telephone Encounter (Signed)
Spoke with patient today during his virtual visit about him begin argumentative during phone call with CMA. He apologized and stated that it was his fault that he was mistaken on when his previous visit was and that he was due for regular 3 month follow up. I told him that argumentative behavior toward the staff would not be tolerated.

## 2019-06-18 ENCOUNTER — Encounter: Payer: Self-pay | Admitting: Family Medicine

## 2019-06-24 ENCOUNTER — Other Ambulatory Visit (INDEPENDENT_AMBULATORY_CARE_PROVIDER_SITE_OTHER): Payer: Medicare Other

## 2019-06-24 DIAGNOSIS — Z794 Long term (current) use of insulin: Secondary | ICD-10-CM | POA: Diagnosis not present

## 2019-06-24 DIAGNOSIS — E1165 Type 2 diabetes mellitus with hyperglycemia: Secondary | ICD-10-CM | POA: Diagnosis not present

## 2019-06-25 LAB — COMPREHENSIVE METABOLIC PANEL
ALT: 19 U/L (ref 0–53)
AST: 10 U/L (ref 0–37)
Albumin: 4.1 g/dL (ref 3.5–5.2)
Alkaline Phosphatase: 56 U/L (ref 39–117)
BUN: 15 mg/dL (ref 6–23)
CO2: 27 mEq/L (ref 19–32)
Calcium: 9.3 mg/dL (ref 8.4–10.5)
Chloride: 100 mEq/L (ref 96–112)
Creatinine, Ser: 0.91 mg/dL (ref 0.40–1.50)
GFR: 86.24 mL/min (ref >60.00)
Glucose, Bld: 240 mg/dL — ABNORMAL HIGH (ref 70–99)
Potassium: 4.9 mEq/L (ref 3.5–5.1)
Sodium: 135 mEq/L (ref 135–145)
Total Bilirubin: 0.5 mg/dL (ref 0.2–1.2)
Total Protein: 6.5 g/dL (ref 6.0–8.3)

## 2019-06-25 LAB — HEMOGLOBIN A1C: Hgb A1c MFr Bld: 10.2 % — ABNORMAL HIGH (ref 4.6–6.5)

## 2019-06-30 ENCOUNTER — Ambulatory Visit: Payer: Medicare Other | Admitting: Family Medicine

## 2019-07-03 ENCOUNTER — Other Ambulatory Visit: Payer: Self-pay | Admitting: Family Medicine

## 2019-07-03 DIAGNOSIS — F32A Depression, unspecified: Secondary | ICD-10-CM

## 2019-07-03 DIAGNOSIS — F329 Major depressive disorder, single episode, unspecified: Secondary | ICD-10-CM

## 2019-07-06 NOTE — Telephone Encounter (Signed)
Last OV 06/16/2019 Last refilled 05/03/2019 #90 x 1 refill. Sig: TAKE 1 TABLET BY MOUTH 3 TIMES A DAY AS NEEDED FOR ANXIETY  Upcoming OV With Family Medicine Kevin Belfast, FNP) 09/15/2019 at 8:00 AM   Please advise, thanks.

## 2019-08-07 ENCOUNTER — Other Ambulatory Visit: Payer: Self-pay | Admitting: Family Medicine

## 2019-08-07 DIAGNOSIS — F329 Major depressive disorder, single episode, unspecified: Secondary | ICD-10-CM

## 2019-08-07 DIAGNOSIS — F419 Anxiety disorder, unspecified: Secondary | ICD-10-CM

## 2019-08-10 NOTE — Telephone Encounter (Signed)
Last OV 06/16/2019 Last refilled 07/06/19 #90 x 0 refill.  Sig: TAKE 1 TABLET BY MOUTH 3 TIMES A DAY AS NEEDED FOR ANXIETY  Upcoming OV With Family Medicine Emi Belfast, FNP) 09/15/2019 at 8:00 AM  Please advise, thanks.

## 2019-08-20 DIAGNOSIS — H5203 Hypermetropia, bilateral: Secondary | ICD-10-CM | POA: Diagnosis not present

## 2019-08-20 DIAGNOSIS — H524 Presbyopia: Secondary | ICD-10-CM | POA: Diagnosis not present

## 2019-08-20 DIAGNOSIS — H52223 Regular astigmatism, bilateral: Secondary | ICD-10-CM | POA: Diagnosis not present

## 2019-08-20 DIAGNOSIS — E113293 Type 2 diabetes mellitus with mild nonproliferative diabetic retinopathy without macular edema, bilateral: Secondary | ICD-10-CM | POA: Diagnosis not present

## 2019-08-20 LAB — HM DIABETES EYE EXAM

## 2019-09-09 ENCOUNTER — Ambulatory Visit (INDEPENDENT_AMBULATORY_CARE_PROVIDER_SITE_OTHER): Payer: Medicare Other

## 2019-09-09 ENCOUNTER — Other Ambulatory Visit: Payer: Self-pay

## 2019-09-09 VITALS — Wt 285.0 lb

## 2019-09-09 DIAGNOSIS — Z0000 Encounter for general adult medical examination without abnormal findings: Secondary | ICD-10-CM | POA: Diagnosis not present

## 2019-09-09 NOTE — Progress Notes (Signed)
PCP notes:  Health Maintenance: Colonoscopy- due, Patient will complete Cologuard kit instead Tdap- insurance/financial   Abnormal Screenings: none   Patient concerns: none   Nurse concerns: none   Next PCP appt.: 09/15/2019 @ 8 am

## 2019-09-09 NOTE — Patient Instructions (Signed)
Kevin Huff , Thank you for taking time to come for your Medicare Wellness Visit. I appreciate your ongoing commitment to your health goals. Please review the following plan we discussed and let me know if I can assist you in the future.   Screening recommendations/referrals: Colonoscopy: will do Cologuard kit  Recommended yearly ophthalmology/optometry visit for glaucoma screening and checkup Recommended yearly dental visit for hygiene and checkup  Vaccinations: Influenza vaccine: Up to date, completed 05/11/2019 Pneumococcal vaccine: age 54 Tdap vaccine: decline Shingles vaccine: never had chickenpox    Advanced directives: Please bring a copy of your POA (Power of [place]) and/or Living Will to your next appointment.   Conditions/risks identified: diabetes, hypertension  Next appointment: 09/15/2019 @ 8 am   Preventive Care 40-64 Years, Male Preventive care refers to lifestyle choices and visits with your health care provider that can promote health and wellness. What does preventive care include?  A yearly physical exam. This is also called an annual well check.  Dental exams once or twice a year.  Routine eye exams. Ask your health care provider how often you should have your eyes checked.  Personal lifestyle choices, including:  Daily care of your teeth and gums.  Regular physical activity.  Eating a healthy diet.  Avoiding tobacco and drug use.  Limiting alcohol use.  Practicing safe sex.  Taking low-dose aspirin every day starting at age 67. What happens during an annual well check? The services and screenings done by your health care provider during your annual well check will depend on your age, overall health, lifestyle risk factors, and family history of disease. Counseling  Your health care provider may ask you questions about your:  Alcohol use.  Tobacco use.  Drug use.  Emotional well-being.  Home and relationship well-being.  Sexual  activity.  Eating habits.  Work and work Statistician. Screening  You may have the following tests or measurements:  Height, weight, and BMI.  Blood pressure.  Lipid and cholesterol levels. These may be checked every 5 years, or more frequently if you are over 44 years old.  Skin check.  Lung cancer screening. You may have this screening every year starting at age 71 if you have a 30-pack-year history of smoking and currently smoke or have quit within the past 15 years.  Fecal occult blood test (FOBT) of the stool. You may have this test every year starting at age 36.  Flexible sigmoidoscopy or colonoscopy. You may have a sigmoidoscopy every 5 years or a colonoscopy every 10 years starting at age 6.  Prostate cancer screening. Recommendations will vary depending on your family history and other risks.  Hepatitis C blood test.  Hepatitis B blood test.  Sexually transmitted disease (STD) testing.  Diabetes screening. This is done by checking your blood sugar (glucose) after you have not eaten for a while (fasting). You may have this done every 1-3 years. Discuss your test results, treatment options, and if necessary, the need for more tests with your health care provider. Vaccines  Your health care provider may recommend certain vaccines, such as:  Influenza vaccine. This is recommended every year.  Tetanus, diphtheria, and acellular pertussis (Tdap, Td) vaccine. You may need a Td booster every 10 years.  Zoster vaccine. You may need this after age 40.  Pneumococcal 13-valent conjugate (PCV13) vaccine. You may need this if you have certain conditions and have not been vaccinated.  Pneumococcal polysaccharide (PPSV23) vaccine. You may need one or two doses if you  smoke cigarettes or if you have certain conditions. Talk to your health care provider about which screenings and vaccines you need and how often you need them. This information is not intended to replace advice  given to you by your health care provider. Make sure you discuss any questions you have with your health care provider. Document Released: 06/23/2015 Document Revised: 02/14/2016 Document Reviewed: 03/28/2015 Elsevier Interactive Patient Education  2017 [place] Prevention in the Home Falls can cause injuries. They can happen to people of all ages. There are many things you can do to make your home safe and to help prevent falls. What can I do on the outside of my home?  Regularly fix the edges of walkways and driveways and fix any cracks.  Remove anything that might make you trip as you walk through a door, such as a raised step or threshold.  Trim any bushes or trees on the path to your home.  Use bright outdoor lighting.  Clear any walking paths of anything that might make someone trip, such as rocks or tools.  Regularly check to see if handrails are loose or broken. Make sure that both sides of any steps have handrails.  Any raised decks and porches should have guardrails on the edges.  Have any leaves, snow, or ice cleared regularly.  Use sand or salt on walking paths during winter.  Clean up any spills in your garage right away. This includes oil or grease spills. What can I do in the bathroom?  Use night lights.  Install grab bars by the toilet and in the tub and shower. Do not use towel bars as grab bars.  Use non-skid mats or decals in the tub or shower.  If you need to sit down in the shower, use a plastic, non-slip stool.  Keep the floor dry. Clean up any water that spills on the floor as soon as it happens.  Remove soap buildup in the tub or shower regularly.  Attach bath mats securely with double-sided non-slip rug tape.  Do not have throw rugs and other things on the floor that can make you trip. What can I do in the bedroom?  Use night lights.  Make sure that you have a light by your bed that is easy to reach.  Do not use any sheets or  blankets that are too big for your bed. They should not hang down onto the floor.  Have a firm chair that has side arms. You can use this for support while you get dressed.  Do not have throw rugs and other things on the floor that can make you trip. What can I do in the kitchen?  Clean up any spills right away.  Avoid walking on wet floors.  Keep items that you use a lot in easy-to-reach places.  If you need to reach something above you, use a strong step stool that has a grab bar.  Keep electrical cords out of the way.  Do not use floor polish or wax that makes floors slippery. If you must use wax, use non-skid floor wax.  Do not have throw rugs and other things on the floor that can make you trip. What can I do with my stairs?  Do not leave any items on the stairs.  Make sure that there are handrails on both sides of the stairs and use them. Fix handrails that are broken or loose. Make sure that handrails are as long as the  stairways.  Check any carpeting to make sure that it is firmly attached to the stairs. Fix any carpet that is loose or worn.  Avoid having throw rugs at the top or bottom of the stairs. If you do have throw rugs, attach them to the floor with carpet tape.  Make sure that you have a light switch at the top of the stairs and the bottom of the stairs. If you do not have them, ask someone to add them for you. What else can I do to help prevent falls?  Wear shoes that:  Do not have high heels.  Have rubber bottoms.  Are comfortable and fit you well.  Are closed at the toe. Do not wear sandals.  If you use a stepladder:  Make sure that it is fully opened. Do not climb a closed stepladder.  Make sure that both sides of the stepladder are locked into place.  Ask someone to hold it for you, if possible.  Clearly mark and make sure that you can see:  Any grab bars or handrails.  First and last steps.  Where the edge of each step is.  Use tools  that help you move around (mobility aids) if they are needed. These include:  Canes.  Walkers.  Scooters.  Crutches.  Turn on the lights when you go into a dark area. Replace any light bulbs as soon as they burn out.  Set up your furniture so you have a clear path. Avoid moving your furniture around.  If any of your floors are uneven, fix them.  If there are any pets around you, be aware of where they are.  Review your medicines with your doctor. Some medicines can make you feel dizzy. This can increase your chance of falling. Ask your doctor what other things that you can do to help prevent falls. This information is not intended to replace advice given to you by your health care provider. Make sure you discuss any questions you have with your health care provider. Document Released: 03/23/2009 Document Revised: 11/02/2015 Document Reviewed: 07/01/2014 Elsevier Interactive Patient Education  2017 [employer].

## 2019-09-09 NOTE — Progress Notes (Signed)
Subjective:   Kevin Huff is a 54 y.o. male who presents for Medicare Annual/Subsequent preventive examination.  Review of Systems: N/A   This visit is being conducted through telemedicine via telephone at the nurse health advisor's home address due to the COVID-19 pandemic. This patient has given me verbal consent via doximity to conduct this visit, patient states they are participating from their home address. Patient and myself are on the telephone call. There is no referral for this visit. Some vital signs may be absent or patient reported.    Patient identification: identified by name, DOB, and current address   Cardiac Risk Factors include: advanced age (>48mn, >>27women);diabetes mellitus;male gender;hypertension     Objective:    Vitals: Wt 285 lb (129.3 kg)   BMI 37.60 kg/m   Body mass index is 37.6 kg/m.  Advanced Directives 09/09/2019 09/05/2015 07/22/2015  Does Patient Have a Medical Advance Directive? Yes Yes No  Type of [place] Living will -  Copy of [place]? No - copy requested - -  Would patient like information on creating a medical advance directive? - - No - patient declined information    Tobacco Social History   Tobacco Use  Smoking Status Never Smoker  Smokeless Tobacco Never Used     Counseling given: Not Answered   Clinical Intake:  Pre-visit preparation completed: Yes  Pain : 0-10 Pain Score: 5  Pain Type: Chronic pain Pain Location: Back Pain Orientation: Lower Pain Descriptors / Indicators: Aching Pain Onset: More than a month ago Pain Frequency: Constant     Nutritional Risks: None Diabetes: Yes CBG done?: No Did pt. bring in CBG monitor from home?: No  How often do you need to have someone help you when you read instructions, pamphlets, or other written materials from your doctor or pharmacy?: 1 - Never What is the last grade level you completed  in school?: 2 years of college  Interpreter Needed?: No  Information entered by :: CJohnson, LPN  Past Medical History:  Diagnosis Date  . Chronic back pain greater than 3 months duration   . Depression   . Diabetes mellitus without complication ([place]   . Hypertension    Past Surgical History:  Procedure Laterality Date  . BACK SURGERY    . KNEE SURGERY     Family History  Problem Relation Age of Onset  . Cancer Mother   . Hyperlipidemia Father   . Hypertension Father   . Diabetes Father   . Heart disease Father    Social History   Socioeconomic History  . Marital status: Married    Spouse name: Not on file  . Number of children: Not on file  . Years of education: Not on file  . Highest education level: Not on file  Occupational History  . Not on file  Tobacco Use  . Smoking status: Never Smoker  . Smokeless tobacco: Never Used  Substance and Sexual Activity  . Alcohol use: No  . Drug use: Never  . Sexual activity: Yes    Partners: Female  Other Topics Concern  . Not on file  Social History Narrative  . Not on file   Social Determinants of Health   Financial Resource Strain: Low Risk   . Difficulty of Paying Living Expenses: Not hard at all  Food Insecurity: No Food Insecurity  . Worried About RCharity fundraiserin the Last Year: Never true  .  Ran Out of Food in the Last Year: Never true  Transportation Needs: No Transportation Needs  . Lack of Transportation (Medical): No  . Lack of Transportation (Non-Medical): No  Physical Activity: Inactive  . Days of Exercise per Week: 0 days  . Minutes of Exercise per Session: 0 min  Stress: No Stress Concern Present  . Feeling of Stress : Not at all  Social Connections:   . Frequency of Communication with Friends and Family:   . Frequency of Social Gatherings with Friends and Family:   . Attends Religious Services:   . Active Member of Clubs or Organizations:   . Attends Archivist Meetings:   Marland Kitchen  Marital Status:     Outpatient Encounter Medications as of 09/09/2019  Medication Sig  . ALPRAZolam (XANAX) 0.5 MG tablet TAKE 1 TABLET BY MOUTH 3 TIMES A DAY AS NEEDED FOR ANXIETY  . atorvastatin (LIPITOR) 20 MG tablet Take 1 tablet by mouth daily, if unable to tolerate, take every other day  . Blood Glucose Monitoring Suppl (ONETOUCH VERIO) w/Device KIT 1 Units by Does not apply route 3 (three) times daily.  . ciprofloxacin (CILOXAN) 0.3 % ophthalmic solution Place 2 drops into both eyes 3 (three) times daily.  Marland Kitchen Fexofenadine HCl (ALLEGRA PO) Take by mouth daily.  Marland Kitchen glipiZIDE (GLUCOTROL) 10 MG tablet TAKE 1 TABLET BY MOUTH TWICE A DAY BEFORE A MEAL  . glucose blood (ONETOUCH VERIO) test strip Use as instructed  . Insulin Glargine, 1 Unit Dial, (TOUJEO SOLOSTAR) 300 UNIT/ML SOPN Inject 10 Units into the skin daily with supper.  . Insulin Pen Needle 31G X 4 MM MISC 1 Units by Does not apply route daily.  Marland Kitchen lisinopril (ZESTRIL) 20 MG tablet Take 1 tablet (20 mg total) by mouth daily.  . meloxicam (MOBIC) 15 MG tablet TAKE 1 TABLET BY MOUTH DAILY  . metFORMIN (GLUCOPHAGE) 1000 MG tablet Take 1 tablet (1,000 mg total) by mouth 2 (two) times daily with a meal.  . oxyCODONE-acetaminophen (PERCOCET) 10-325 MG tablet Take 1 tablet by mouth every 8 (eight) hours as needed for pain.  Marland Kitchen oxyCODONE-acetaminophen (PERCOCET) 10-325 MG tablet Take 1 tablet by mouth every 8 (eight) hours as needed for pain.  Marland Kitchen oxyCODONE-acetaminophen (PERCOCET) 10-325 MG tablet Take 1 tablet by mouth every 8 (eight) hours as needed for pain.  . traZODone (DESYREL) 100 MG tablet Take 2 tablets (200 mg total) by mouth at bedtime as needed for sleep.   No facility-administered encounter medications on file as of 09/09/2019.    Activities of Daily Living In your present state of health, do you have any difficulty performing the following activities: 09/09/2019  Hearing? N  Vision? N  Difficulty concentrating or making decisions?  N  Walking or climbing stairs? N  Dressing or bathing? N  Doing errands, shopping? N  Preparing Food and eating ? N  Using the Toilet? N  In the past six months, have you accidently leaked urine? N  Do you have problems with loss of bowel control? N  Managing your Medications? N  Managing your Finances? N  Housekeeping or managing your Housekeeping? N  Some recent data might be hidden    Patient Care Team: Elby Beck, FNP as PCP - General (Nurse Practitioner)   Assessment:   This is a routine wellness examination for Kevin Huff.  Exercise Activities and Dietary recommendations Current Exercise Habits: The patient does not participate in regular exercise at present(was going to [facility] before COVID), Exercise  limited by: orthopedic condition(s)  Goals    . Patient Stated     09/09/2019, I will maintain and continue medications as prescribed.        Fall Risk Fall Risk  09/09/2019  Falls in the past year? 1  Comment tripped and fell  Number falls in past yr: 1  Injury with Fall? 0  Risk for fall due to : History of fall(s);Impaired balance/gait;Impaired mobility;Medication side effect  Follow up Falls evaluation completed;Falls prevention discussed   Is the patient's home free of loose throw rugs in walkways, pet beds, electrical cords, etc?   yes      Grab bars in the bathroom? no      Handrails on the stairs?   yes      Adequate lighting?   yes  Timed Get Up and Go Performed: N/A  Depression Screen PHQ 2/9 Scores 09/09/2019 03/01/2019 11/26/2017  PHQ - 2 Score 0 0 2  PHQ- 9 Score 0 1 -    Cognitive Function MMSE - Mini Mental State Exam 09/09/2019  Orientation to time 5  Orientation to Place 5  Registration 3  Attention/ Calculation 5  Recall 3  Language- repeat 1       Mini Cog  Mini-Cog screen was completed. Maximum score is 22. A value of 0 denotes this part of the MMSE was not completed or the patient failed this part of the Mini-Cog  screening.  Immunization History  Administered Date(s) Administered  . Fluad Quad(high Dose 65+) 05/11/2019  . Influenza,inj,Quad PF,6+ Mos 02/27/2018  . Pneumococcal Polysaccharide-23 11/08/2017    Qualifies for Shingles Vaccine: No, Patient says he never had chickenpox  Screening Tests Health Maintenance  Topic Date Due  . COLONOSCOPY  Never done  . TETANUS/TDAP  09/08/2024 (Originally 08/26/1982)  . HEMOGLOBIN A1C  12/22/2019  . INFLUENZA VACCINE  01/09/2020  . FOOT EXAM  02/29/2020  . OPHTHALMOLOGY EXAM  08/25/2020  . PNEUMOCOCCAL POLYSACCHARIDE VACCINE AGE 77-64 HIGH RISK  Completed  . Hepatitis C Screening  Completed  . HIV Screening  Completed   Cancer Screenings: Lung: Low Dose CT Chest recommended if Age 64-80 years, 30 pack-year currently smoking OR have quit w/in 15 years. Patient does not qualify. Colorectal: due, will complete Cologuard kit   Additional Screenings:  Hepatitis C Screening: 02/27/2018      Plan:   Patient will maintain and continue medications as prescribed.   I have personally reviewed and noted the following in the patient's chart:   . Medical and social history . Use of alcohol, tobacco or illicit drugs  . Current medications and supplements . Functional ability and status . Nutritional status . Physical activity . Advanced directives . List of other physicians . Hospitalizations, surgeries, and ER visits in previous 12 months . Vitals . Screenings to include cognitive, depression, and falls . Referrals and appointments  In addition, I have reviewed and discussed with patient certain preventive protocols, quality metrics, and best practice recommendations. A written personalized care plan for preventive services as well as general preventive health recommendations were provided to patient.     Andrez Grime, LPN  [id number]/7225

## 2019-09-10 ENCOUNTER — Other Ambulatory Visit: Payer: Self-pay | Admitting: Family Medicine

## 2019-09-10 DIAGNOSIS — F329 Major depressive disorder, single episode, unspecified: Secondary | ICD-10-CM

## 2019-09-10 DIAGNOSIS — F419 Anxiety disorder, unspecified: Secondary | ICD-10-CM

## 2019-09-11 ENCOUNTER — Other Ambulatory Visit: Payer: Self-pay | Admitting: Family Medicine

## 2019-09-11 DIAGNOSIS — Z794 Long term (current) use of insulin: Secondary | ICD-10-CM

## 2019-09-11 DIAGNOSIS — E1165 Type 2 diabetes mellitus with hyperglycemia: Secondary | ICD-10-CM

## 2019-09-13 NOTE — Telephone Encounter (Signed)
Patient has an upcoming physical on 09/15/19. This rx was last refilled on 08/10/19 for #90 with 0 refills.  Kevin Huff, is this ok to refill?

## 2019-09-15 ENCOUNTER — Ambulatory Visit (INDEPENDENT_AMBULATORY_CARE_PROVIDER_SITE_OTHER): Payer: Medicare Other | Admitting: Family Medicine

## 2019-09-15 ENCOUNTER — Other Ambulatory Visit: Payer: Self-pay

## 2019-09-15 ENCOUNTER — Telehealth: Payer: Self-pay

## 2019-09-15 ENCOUNTER — Encounter: Payer: Self-pay | Admitting: Family Medicine

## 2019-09-15 ENCOUNTER — Other Ambulatory Visit: Payer: Self-pay | Admitting: Family Medicine

## 2019-09-15 VITALS — BP 138/88 | HR 94 | Temp 98.3°F | Wt 275.0 lb

## 2019-09-15 DIAGNOSIS — G8929 Other chronic pain: Secondary | ICD-10-CM

## 2019-09-15 DIAGNOSIS — Z6836 Body mass index (BMI) 36.0-36.9, adult: Secondary | ICD-10-CM

## 2019-09-15 DIAGNOSIS — G4700 Insomnia, unspecified: Secondary | ICD-10-CM

## 2019-09-15 DIAGNOSIS — Z794 Long term (current) use of insulin: Secondary | ICD-10-CM | POA: Diagnosis not present

## 2019-09-15 DIAGNOSIS — Z1211 Encounter for screening for malignant neoplasm of colon: Secondary | ICD-10-CM

## 2019-09-15 DIAGNOSIS — M961 Postlaminectomy syndrome, not elsewhere classified: Secondary | ICD-10-CM

## 2019-09-15 DIAGNOSIS — E1165 Type 2 diabetes mellitus with hyperglycemia: Secondary | ICD-10-CM

## 2019-09-15 DIAGNOSIS — F1190 Opioid use, unspecified, uncomplicated: Secondary | ICD-10-CM

## 2019-09-15 DIAGNOSIS — M5442 Lumbago with sciatica, left side: Secondary | ICD-10-CM | POA: Diagnosis not present

## 2019-09-15 LAB — POCT GLYCOSYLATED HEMOGLOBIN (HGB A1C): Hemoglobin A1C: 9.9 % — AB (ref 4.0–5.6)

## 2019-09-15 MED ORDER — OXYCODONE-ACETAMINOPHEN 10-325 MG PO TABS: 1.0000 | ORAL_TABLET | Freq: Three times a day (TID) | ORAL | 0 refills | Status: DC | PRN

## 2019-09-15 NOTE — Progress Notes (Signed)
Subjective:    Patient ID: Kevin Huff, male    DOB: [birth year], 54 y.o.   MRN: [id number]  HPI Chief Complaint  Patient presents with  . 3 month follow up   DM type 2- currently on Tujeo 20 Units, metformin 1000 mg po bid. AM blood sugar is 150-200. Not checking every day. Carb heavy diet, beans, sweets. Has not increased his insulin as instructed, reports that he can not afford, that prices have gone up since Jan.    Chronic pain- no better than 5/10 ever, sometimes 7-10/ 10. Taking oxycodone tid. Has not been able to exercise, can't get into [employer], limited to being able to swim for exercise but no availability.   Anxiety- unchanged.    Review of Systems Per HPI    Objective:   Physical Exam    BP (!) 150/94   Pulse 94   Temp 98.3 F (36.8 C) (Temporal)   Wt 275 lb (124.7 kg)   SpO2 95%   BMI 36.28 kg/m  Wt Readings from Last 3 Encounters:  09/15/19 275 lb (124.7 kg)  09/09/19 285 lb (129.3 kg)  06/16/19 282 lb (127.9 kg)   BP Readings from Last 3 Encounters:  09/15/19 138/88  03/01/19 132/84  12/28/18 135/84    Depression screen PHQ 2/9 09/09/2019 03/01/2019 11/26/2017  Decreased Interest 0 0 1  Down, Depressed, Hopeless 0 0 1  PHQ - 2 Score 0 0 2  Altered sleeping 0 0 -  Tired, decreased energy 0 0 -  Change in appetite 0 0 -  Feeling bad or failure about yourself  0 1 -  Trouble concentrating 0 0 -  Moving slowly or fidgety/restless 0 0 -  Suicidal thoughts 0 0 -  PHQ-9 Score 0 1 -  Difficult doing work/chores Not difficult at all Not difficult at all -    Results for orders placed or performed in visit on 09/15/19  HgB A1c  Result Value Ref Range   Hemoglobin A1C 9.9 (A) 4.0 - 5.6 %   HbA1c POC (<> result, manual entry)     HbA1c, POC (prediabetic range)     HbA1c, POC (controlled diabetic range)         Assessment & Plan:  1. Type 2 diabetes mellitus with hyperglycemia, with long-term current use of insulin (HCC) - slight improvement of  hemoglobin A1c, still not at goal. Challenges with affording insulin. Pharmacist, Phil Dopp consulted.  - slight weight loss, encouraged him to continue to cut back on carbs, increase exercise - HgB A1c  2. Screening for colon cancer - Cologuard  3. Chronic midline low back pain with left-sided sciatica - unchanged, encouraged him to explore alternative exercise if he can not get access to pool- chair yoga, recumbent bike, walking as able - oxyCODONE-acetaminophen (PERCOCET) 10-325 MG tablet; Take 1 tablet by mouth every 8 (eight) hours as needed for pain.  Dispense: 90 tablet; Refill: 0 - oxyCODONE-acetaminophen (PERCOCET) 10-325 MG tablet; Take 1 tablet by mouth every 8 (eight) hours as needed for pain.  Dispense: 90 tablet; Refill: 0 - oxyCODONE-acetaminophen (PERCOCET) 10-325 MG tablet; Take 1 tablet by mouth every 8 (eight) hours as needed for pain.  Dispense: 90 tablet; Refill: 0  4. Lumbar post-laminectomy syndrome - oxyCODONE-acetaminophen (PERCOCET) 10-325 MG tablet; Take 1 tablet by mouth every 8 (eight) hours as needed for pain.  Dispense: 90 tablet; Refill: 0 - oxyCODONE-acetaminophen (PERCOCET) 10-325 MG tablet; Take 1 tablet by mouth every 8 (eight) hours  as needed for pain.  Dispense: 90 tablet; Refill: 0  5. Class 2 severe obesity due to excess calories with serious comorbidity and body mass index (BMI) of 36.0 to 36.9 in adult [facility]) - per #1, some weight loss, encouraged decreased carbs, increased activity  6. Chronic, continuous use of opioids  Indication for chronic opioid: lumbar post-laminectomy syndrome, chronic midline low back pain with left side sciatica Medication and dose: oxycodone-percocet 10-325 mg q8hours prn # pills per month: 90 Last UDS date: 9/20 Opioid Treatment Agreement signed (Y/N): y Opioid Treatment Agreement last reviewed with patient:  y Rinard reviewed this encounter (include red flags): Yes  This visit occurred during the SARS-CoV-2  public health emergency.  Safety protocols were in place, including screening questions prior to the visit, additional usage of staff PPE, and extensive cleaning of exam room while observing appropriate contact time as indicated for disinfecting solutions.    Clarene Reamer, FNP-BC  [facility] at [facility], [facility]  09/15/2019 4:58 PM

## 2019-09-15 NOTE — Patient Instructions (Signed)
Good to see you today  Try to decrease your carbohydrates to less than 20 grams per meal  Book- The Obesity Code Youtube- Dr. Wylene Simmer, Dr. Allyson Sabal  Follow up in 3 months

## 2019-09-15 NOTE — Telephone Encounter (Signed)
Contacted patient today to schedule an appointment to discuss diabetes medication management (requested by PCP). Appointment scheduled for October 01, 2019 at 9:30 AM (telephone).   Phil Dopp, PharmD Clinical Pharmacist [facility] at [facility] [phone]

## 2019-09-15 NOTE — Telephone Encounter (Signed)
Name of Medication: Oxycodone  Name of Pharmacy: [facility] Last [place] or Written Date and Quantity: 08-14-19 #90 Last Office Visit and Type: Today Next Office Visit and Type: Next OV 12-15-19 Last Controlled Substance Agreement Date: 03-01-19 Last UDS: 03-01-19  Trazodone last filled 05-31-19 #180

## 2019-09-17 ENCOUNTER — Telehealth: Payer: Self-pay

## 2019-09-17 MED ORDER — TRAZODONE HCL 100 MG PO TABS: 200.0000 mg | ORAL_TABLET | Freq: Every evening | ORAL | 1 refills | Status: DC | PRN

## 2019-09-17 NOTE — Telephone Encounter (Signed)
[facility] is calling in regards to the refill request They stated they received the message "Request already responded to by other means"    They stated they did not receive a refill for this medication and was not sure if maybe the refill was just denied. They would like a call back with the update  PHONE- [phone]

## 2019-09-17 NOTE — Addendum Note (Signed)
Addended by: Consuella Lose on: 09/17/2019 03:20 PM   Modules accepted: Orders

## 2019-09-17 NOTE — Telephone Encounter (Signed)
Spoke with the pharmacist and they did receive RX refills for oxycodone x 3 on 09/15/19, they were calling about Trazodone RX. This was denied stating that it was already refilled but looking at Epic this has not been filled since September 2020 with a refill. RX sent in at this time.

## 2019-09-17 NOTE — Telephone Encounter (Signed)
Received fax from [facility] with Cologuard reporting they missing information on an order. Contacted them and they advised this pt's order does not include the provider NPI number and they cannot process the order without that. They need the NPI added to the order and faxed back to them. Advised the order int the chart has an NPI so they are going to fax the order they currently have so it can be followed up on.

## 2019-09-20 NOTE — Telephone Encounter (Signed)
Reprinted order and faxed.

## 2019-09-23 DIAGNOSIS — Z1211 Encounter for screening for malignant neoplasm of colon: Secondary | ICD-10-CM | POA: Diagnosis not present

## 2019-09-23 LAB — COLOGUARD: Cologuard: NEGATIVE

## 2019-09-28 LAB — COLOGUARD: COLOGUARD: NEGATIVE

## 2019-10-01 ENCOUNTER — Other Ambulatory Visit: Payer: Self-pay | Admitting: Family Medicine

## 2019-10-01 DIAGNOSIS — E1165 Type 2 diabetes mellitus with hyperglycemia: Secondary | ICD-10-CM

## 2019-10-01 NOTE — Telephone Encounter (Signed)
Rx was last refilled 03/01/19 for #180 with 1 refill. Patient was last seen 09/15/19 for a CPE.  Kevin Huff, is this ok to refill?

## 2019-10-04 ENCOUNTER — Telehealth: Payer: Self-pay | Admitting: Family Medicine

## 2019-10-04 ENCOUNTER — Encounter: Payer: Self-pay | Admitting: Family Medicine

## 2019-10-04 NOTE — Telephone Encounter (Signed)
Results reviewed with patient, expressed understanding. Nothing further needed.   

## 2019-10-04 NOTE — Telephone Encounter (Signed)
Please call patient and let him know that his Cologuard cancer screening test was negative. He will need to repeat in 3 years. Results to CMA to abstract, send to scan.

## 2019-10-04 NOTE — Telephone Encounter (Signed)
I left message for patient to return phone call. Chart has been abstracted & results have been sent to scan.

## 2019-10-14 ENCOUNTER — Other Ambulatory Visit: Payer: Self-pay | Admitting: Family Medicine

## 2019-10-14 DIAGNOSIS — F32A Depression, unspecified: Secondary | ICD-10-CM

## 2019-10-14 DIAGNOSIS — F419 Anxiety disorder, unspecified: Secondary | ICD-10-CM

## 2019-10-15 NOTE — Telephone Encounter (Signed)
Name of Medication: Alprazolam Name of Pharmacy: [facility] Last [place] or Written Date and Quantity: 09/13/19 #90 with 0 refill Last Office Visit and Type: 09/15/19 follow up Next Office Visit and Type: 12/15/19 follow up Last Controlled Substance Agreement Date: 03/01/2019 Last UDS: 03/01/2019

## 2019-10-23 ENCOUNTER — Other Ambulatory Visit: Payer: Self-pay | Admitting: Family Medicine

## 2019-10-23 DIAGNOSIS — E1165 Type 2 diabetes mellitus with hyperglycemia: Secondary | ICD-10-CM

## 2019-11-15 ENCOUNTER — Other Ambulatory Visit: Payer: Self-pay | Admitting: Family Medicine

## 2019-11-15 DIAGNOSIS — F419 Anxiety disorder, unspecified: Secondary | ICD-10-CM

## 2019-11-17 NOTE — Telephone Encounter (Signed)
Upcoming appt 12/15/19 Last refill 10/15/19 #90 x 1 refill  Please advise, thanks.

## 2019-11-26 ENCOUNTER — Other Ambulatory Visit: Payer: Self-pay | Admitting: Family Medicine

## 2019-11-26 DIAGNOSIS — M961 Postlaminectomy syndrome, not elsewhere classified: Secondary | ICD-10-CM

## 2019-11-26 DIAGNOSIS — I10 Essential (primary) hypertension: Secondary | ICD-10-CM

## 2019-11-26 DIAGNOSIS — Z794 Long term (current) use of insulin: Secondary | ICD-10-CM

## 2019-12-15 ENCOUNTER — Other Ambulatory Visit: Payer: Self-pay

## 2019-12-15 ENCOUNTER — Other Ambulatory Visit: Payer: Self-pay | Admitting: Family Medicine

## 2019-12-15 ENCOUNTER — Encounter: Payer: Self-pay | Admitting: Family Medicine

## 2019-12-15 ENCOUNTER — Ambulatory Visit (INDEPENDENT_AMBULATORY_CARE_PROVIDER_SITE_OTHER): Payer: Medicare Other | Admitting: Family Medicine

## 2019-12-15 VITALS — BP 150/92 | HR 89 | Temp 98.5°F | Resp 18 | Ht 74.0 in | Wt 253.0 lb

## 2019-12-15 DIAGNOSIS — Z794 Long term (current) use of insulin: Secondary | ICD-10-CM

## 2019-12-15 DIAGNOSIS — M5442 Lumbago with sciatica, left side: Secondary | ICD-10-CM

## 2019-12-15 DIAGNOSIS — E1165 Type 2 diabetes mellitus with hyperglycemia: Secondary | ICD-10-CM | POA: Diagnosis not present

## 2019-12-15 DIAGNOSIS — G720 Drug-induced myopathy: Secondary | ICD-10-CM

## 2019-12-15 DIAGNOSIS — M961 Postlaminectomy syndrome, not elsewhere classified: Secondary | ICD-10-CM

## 2019-12-15 DIAGNOSIS — G8929 Other chronic pain: Secondary | ICD-10-CM

## 2019-12-15 LAB — POCT GLYCOSYLATED HEMOGLOBIN (HGB A1C): Hemoglobin A1C: 10.1 % — AB (ref 4.0–5.6)

## 2019-12-15 MED ORDER — OXYCODONE-ACETAMINOPHEN 10-325 MG PO TABS: 1.0000 | ORAL_TABLET | Freq: Three times a day (TID) | ORAL | 0 refills | Status: DC | PRN

## 2019-12-15 MED ORDER — METFORMIN HCL ER 500 MG PO TB24: 1000.0000 mg | ORAL_TABLET | Freq: Two times a day (BID) | ORAL | 2 refills | Status: DC

## 2019-12-15 NOTE — Progress Notes (Signed)
Subjective:    Patient ID: Kevin Huff, male    DOB: Apr 23, 1966, 54 y.o.   MRN: [id number]  HPI Chief Complaint  Patient presents with  . Diabetes    follow up .    This is a 54 yo male who presents today for follow up of chronic medical conditions.   DM type 2- last hgba1c 9.9, down from 10.2 1/21. Has been watching diet more carefully with significant weight loss. Increased exercise, decreasing portions, starches. Blood sugars running 150-180 at home. No polyuria, polydipsia, polyphagia.   Chronic back pain with continuous opioid use- has been up and down. [place] about a month ago, tripped over a piece of furniture, landing on left shoulder. Pain with radiation up neck, improving over time. Walking up and down street daily.   Anxiety- up and down, "worries too much," sleeping ok, 4-5 hours at a time. Taking alprazolam 3x/ day. Has failed multiple SSRI/SNRI due to side effects.   Hyperlipidemia- has been on multiple statins and has tried every other day dosing. Last was written for atorvastatin and encouraged to take every other day or even 2-3 times per week. Patient reports increased muscle and joint pain even with infrequent dosing.    Review of Systems Per HPI    Objective:   Physical Exam       BP (!) 150/92   Pulse 89   Temp 98.5 F (36.9 C) (Temporal)   Resp 18   Ht 6\' 2"  (1.88 m)   Wt 253 lb (114.8 kg)   SpO2 98%   BMI 32.48 kg/m  Wt Readings from Last 3 Encounters:  12/15/19 253 lb (114.8 kg)  09/15/19 275 lb (124.7 kg)  09/09/19 285 lb (129.3 kg)   BP Readings from Last 3 Encounters:  12/15/19 (!) 150/92  09/15/19 138/88  03/01/19 132/84   Results for orders placed or performed in visit on 12/15/19  HgB A1c  Result Value Ref Range   Hemoglobin A1C 10.1 (A) 4.0 - 5.6 %   HbA1c POC (<> result, manual entry)     HbA1c, POC (prediabetic range)     HbA1c, POC (controlled diabetic range)       Assessment & Plan:  1. Type 2 diabetes mellitus with  hyperglycemia, with long-term current use of insulin (HCC) - patient with increased HgBA1c, discussed results with patient. Declines endocrine referral at this time, discussed long term effects of hyperglycemia.  - Will change metformin to extended release - Touching base with clinical pharmacist to see if any assistance can be provided - Informed patient that he will have to go to endocrine if no significant improvement in A1c at follow up - HgB A1c - metFORMIN (GLUCOPHAGE-XR) 500 MG 24 hr tablet; Take 2 tablets (1,000 mg total) by mouth 2 (two) times daily.  Dispense: 120 tablet; Refill: 2  2. Chronic midline low back pain with left-sided sciatica - Indication for chronic opioid: chronic midline low back pain, lumbar post laminectomy syndrome Medication and dose: oxycodone- acetaminophen 10-325 q 8 prn # pills per month: 90 Last UDS date: 9/20 Opioid Treatment Agreement signed (Y/N): yes Opioid Treatment Agreement last reviewed with patient:  9/20 NCCSRS reviewed this encounter (include red flags): Yes - oxyCODONE-acetaminophen (PERCOCET) 10-325 MG tablet; Take 1 tablet by mouth every 8 (eight) hours as needed for pain.  Dispense: 90 tablet; Refill: 0 - oxyCODONE-acetaminophen (PERCOCET) 10-325 MG tablet; Take 1 tablet by mouth every 8 (eight) hours as needed for pain.  Dispense:  90 tablet; Refill: 0 - oxyCODONE-acetaminophen (PERCOCET) 10-325 MG tablet; Take 1 tablet by mouth every 8 (eight) hours as needed for pain.  Dispense: 90 tablet; Refill: 0  3. Lumbar post-laminectomy syndrome - oxyCODONE-acetaminophen (PERCOCET) 10-325 MG tablet; Take 1 tablet by mouth every 8 (eight) hours as needed for pain.  Dispense: 90 tablet; Refill: 0 - oxyCODONE-acetaminophen (PERCOCET) 10-325 MG tablet; Take 1 tablet by mouth every 8 (eight) hours as needed for pain.  Dispense: 90 tablet; Refill: 0 - oxyCODONE-acetaminophen (PERCOCET) 10-325 MG tablet; Take 1 tablet by mouth every 8 (eight) hours as needed  for pain.  Dispense: 90 tablet; Refill: 0  4. Drug-induced myopathy - unable to tolerate statin therapy  - follow up in 3 months  This visit occurred during the SARS-CoV-2 public health emergency.  Safety protocols were in place, including screening questions prior to the visit, additional usage of staff PPE, and extensive cleaning of exam room while observing appropriate contact time as indicated for disinfecting solutions.      Olean Ree, FNP-BC  [facility] at [facility], [facility]  12/22/2019 7:56 AM

## 2019-12-15 NOTE — Patient Instructions (Addendum)
Good to see you today  I will send in your medications.   If blood sugar not significantly improved by 10/21, will need to see endocrinologist  Avoid carbs- bread, pasta, potatoes, crackers, chips, etc.   Increase your walking to twice a day

## 2019-12-17 NOTE — Telephone Encounter (Signed)
Please refuse medications so that this encounter can be closed. This was last filled x3 Rxs on 12/15/19  Thanks

## 2020-01-15 ENCOUNTER — Other Ambulatory Visit: Payer: Self-pay | Admitting: Family Medicine

## 2020-01-15 DIAGNOSIS — F419 Anxiety disorder, unspecified: Secondary | ICD-10-CM

## 2020-01-17 NOTE — Telephone Encounter (Signed)
LAST 12/15/19 LAST REFILL 11/17/19  PLEASE ADVISE  REFILL Rx Xanax

## 2020-02-15 ENCOUNTER — Other Ambulatory Visit: Payer: Self-pay | Admitting: Family Medicine

## 2020-02-15 DIAGNOSIS — F32A Depression, unspecified: Secondary | ICD-10-CM

## 2020-02-15 NOTE — Telephone Encounter (Signed)
Last office visit 12/15/2019.  Upcoming appt 03/17/2020. Refill 01/17/2020 #90.

## 2020-03-17 ENCOUNTER — Encounter: Payer: Self-pay | Admitting: Family Medicine

## 2020-03-17 ENCOUNTER — Other Ambulatory Visit: Payer: Self-pay | Admitting: Family Medicine

## 2020-03-17 ENCOUNTER — Ambulatory Visit: Payer: Medicare Other | Admitting: Family Medicine

## 2020-03-17 ENCOUNTER — Ambulatory Visit (INDEPENDENT_AMBULATORY_CARE_PROVIDER_SITE_OTHER): Payer: Medicare Other | Admitting: Family Medicine

## 2020-03-17 ENCOUNTER — Other Ambulatory Visit: Payer: Self-pay

## 2020-03-17 VITALS — BP 138/88 | HR 109 | Temp 97.9°F | Ht 72.5 in | Wt 267.0 lb

## 2020-03-17 DIAGNOSIS — Z794 Long term (current) use of insulin: Secondary | ICD-10-CM

## 2020-03-17 DIAGNOSIS — G8929 Other chronic pain: Secondary | ICD-10-CM

## 2020-03-17 DIAGNOSIS — F419 Anxiety disorder, unspecified: Secondary | ICD-10-CM

## 2020-03-17 DIAGNOSIS — E785 Hyperlipidemia, unspecified: Secondary | ICD-10-CM

## 2020-03-17 DIAGNOSIS — Z6836 Body mass index (BMI) 36.0-36.9, adult: Secondary | ICD-10-CM | POA: Diagnosis not present

## 2020-03-17 DIAGNOSIS — E1169 Type 2 diabetes mellitus with other specified complication: Secondary | ICD-10-CM | POA: Diagnosis not present

## 2020-03-17 DIAGNOSIS — F1190 Opioid use, unspecified, uncomplicated: Secondary | ICD-10-CM

## 2020-03-17 DIAGNOSIS — E1165 Type 2 diabetes mellitus with hyperglycemia: Secondary | ICD-10-CM | POA: Diagnosis not present

## 2020-03-17 DIAGNOSIS — I10 Essential (primary) hypertension: Secondary | ICD-10-CM

## 2020-03-17 DIAGNOSIS — M5442 Lumbago with sciatica, left side: Secondary | ICD-10-CM

## 2020-03-17 DIAGNOSIS — G894 Chronic pain syndrome: Secondary | ICD-10-CM | POA: Diagnosis not present

## 2020-03-17 DIAGNOSIS — F32A Depression, unspecified: Secondary | ICD-10-CM

## 2020-03-17 DIAGNOSIS — M545 Low back pain, unspecified: Secondary | ICD-10-CM | POA: Diagnosis not present

## 2020-03-17 DIAGNOSIS — M961 Postlaminectomy syndrome, not elsewhere classified: Secondary | ICD-10-CM

## 2020-03-17 LAB — LIPID PANEL
Cholesterol: 202 mg/dL — ABNORMAL HIGH (ref 0–200)
HDL: 38.6 mg/dL — ABNORMAL LOW (ref >39.00)
LDL Cholesterol: 131 mg/dL — ABNORMAL HIGH (ref 0–99)
NonHDL: 163.74
Total CHOL/HDL Ratio: 5
Triglycerides: 164 mg/dL — ABNORMAL HIGH (ref 0.0–149.0)
VLDL: 32.8 mg/dL (ref 0.0–40.0)

## 2020-03-17 LAB — MICROALBUMIN / CREATININE URINE RATIO
Creatinine,U: 27 mg/dL
Microalb Creat Ratio: 67 mg/g — ABNORMAL HIGH (ref 0.0–30.0)
Microalb, Ur: 18.1 mg/dL — ABNORMAL HIGH (ref 0.0–1.9)

## 2020-03-17 LAB — COMPREHENSIVE METABOLIC PANEL
ALT: 21 U/L (ref 0–53)
AST: 14 U/L (ref 0–37)
Albumin: 4.7 g/dL (ref 3.5–5.2)
Alkaline Phosphatase: 61 U/L (ref 39–117)
BUN: 17 mg/dL (ref 6–23)
CO2: 27 mEq/L (ref 19–32)
Calcium: 10.4 mg/dL (ref 8.4–10.5)
Chloride: 97 mEq/L (ref 96–112)
Creatinine, Ser: 0.98 mg/dL (ref 0.40–1.50)
GFR: 85.5 mL/min (ref >60.00)
Glucose, Bld: 349 mg/dL — ABNORMAL HIGH (ref 70–99)
Potassium: 5.9 mEq/L — ABNORMAL HIGH (ref 3.5–5.1)
Sodium: 135 mEq/L (ref 135–145)
Total Bilirubin: 1 mg/dL (ref 0.2–1.2)
Total Protein: 7.5 g/dL (ref 6.0–8.3)

## 2020-03-17 LAB — POCT GLYCOSYLATED HEMOGLOBIN (HGB A1C): Hemoglobin A1C: 11.8 % — AB (ref 4.0–5.6)

## 2020-03-17 LAB — VITAMIN D 25 HYDROXY (VIT D DEFICIENCY, FRACTURES): VITD: 33.96 ng/mL (ref 30.00–100.00)

## 2020-03-17 LAB — VITAMIN B12: Vitamin B-12: 272 pg/mL (ref 211–911)

## 2020-03-17 NOTE — Progress Notes (Signed)
Subjective:    Patient ID: Kevin Huff, male    DOB: Aug 10, 1965, 54 y.o.   MRN: [id number]  HPI Chief Complaint  Patient presents with  . Follow-up    a1c   This is a 54 yo male who presents today for follow up of DM type 2 with hyperglycemia.   Increased pain with weather changes. Has not been able to get to gym to swim. Has been walking twice a day for 1/2 mile, some leg exercises with exercise ball. Pain varies from mild, achy, to sharp, stabbing. Medication enables him to perform daily activities.   DM type 2- currently on glipizide 10 mg bid, metformin xr 1000 mg twice a day. Toujeo 10 units at bedtime. Home readings 170-200. Highest around 200. Has been shortening his eating window. Only drinks water. Has been avoiding bread. Fasting blood sugars higher. Insulin is $500 for 3 pens. Can not afford to go up on dose.   Sleep- usual, 4-5 hours, naps in afternoon.   Getting ready to go to the beach for a couple of weeks.   Has not been vaccinated against COVID-19 and does not intend to get vaccinated at this time.  Thinks he may have had Covid previously.  Was not tested.   Review of Systems Denies chest pain, SOB, abdominal pain, diarrhea/ constipation. Taking coconut water to help with bowels.     Objective:   Physical Exam Vitals reviewed.  Constitutional:      General: He is not in acute distress.    Appearance: Normal appearance. He is obese. He is not ill-appearing, toxic-appearing or diaphoretic.  HENT:     Head: Normocephalic and atraumatic.     Nose: Nose normal.     Mouth/Throat:     Mouth: Mucous membranes are moist.  Cardiovascular:     Rate and Rhythm: Normal rate and regular rhythm.     Heart sounds: Normal heart sounds.  Pulmonary:     Effort: Pulmonary effort is normal.     Breath sounds: Normal breath sounds.  Musculoskeletal:     Right lower leg: No edema.     Left lower leg: No edema.  Skin:    General: Skin is warm and dry.  Neurological:      Mental Status: He is alert and oriented to person, place, and time.  Psychiatric:        Mood and Affect: Mood normal.        Behavior: Behavior normal.        Thought Content: Thought content normal.        Judgment: Judgment normal.          BP 138/88   Pulse (!) 109   Temp 97.9 F (36.6 C) (Temporal)   Ht 6' 0.5" (1.842 m)   Wt 267 lb (121.1 kg)   SpO2 97%   BMI 35.71 kg/m  Wt Readings from Last 3 Encounters:  03/17/20 267 lb (121.1 kg)  12/15/19 253 lb (114.8 kg)  09/15/19 275 lb (124.7 kg)   Results for orders placed or performed in visit on 03/17/20  HgB A1c  Result Value Ref Range   Hemoglobin A1C 11.8 (A) 4.0 - 5.6 %   HbA1c POC (<> result, manual entry)     HbA1c, POC (prediabetic range)     HbA1c, POC (controlled diabetic range)     Diabetic Foot Exam - Simple   Simple Foot Form Diabetic Foot exam was performed with the following findings: Yes 03/17/2020  11:08 AM  Visual Inspection No deformities, no ulcerations, no other skin breakdown bilaterally: Yes Sensation Testing See comments: Yes Pulse Check Posterior Tibialis and Dorsalis pulse intact bilaterally: Yes See comments: Yes Comments Could not feel monofilament on right great toe.      . Assessment & Plan:  1. Type 2 diabetes mellitus with hyperglycemia, with long-term current use of insulin (HCC) -Hemoglobin A1c not at goal, have discussed with patient previously and unable to significantly improve his numbers in fact he has increased hemoglobin A1c this visit.  Will refer to endocrine.  Patient reluctantly agreeable. - HgB A1c - Lipid Panel - Comprehensive metabolic panel - Vitamin B12 - Microalbumin / creatinine urine ratio - Ambulatory referral to Endocrinology  2. Essential hypertension -Currently acceptable blood pressure readings - Lipid Panel - Comprehensive metabolic panel  3. Class 2 severe obesity due to excess calories with serious comorbidity and body mass index (BMI) of 36.0  to 36.9 in adult [facility]) -He has declined further diabetes education/nutrition consult.  Encouraged continue exercise as tolerated with reduction of carbohydrate intake. - Lipid Panel - Comprehensive metabolic panel - Vitamin D, 25-hydroxy - Ambulatory referral to Endocrinology  4. Hyperlipidemia associated with type 2 diabetes mellitus (HCC) -Have tried him on statins in the past and he reports intolerance with increased muscle pain - Lipid Panel - Comprehensive metabolic panel - Ambulatory referral to Endocrinology  5. Chronic, continuous use of opioids Indication for chronic opioid: lumbar laminectomy syndrome, chronic low back pain with sciatica Medication and dose: Oxycodone-acetaminophen 10-3 25 1  p.o. every 8 as needed # pills per month: 90 Last UDS date: Today Opioid Treatment Agreement signed (Y/N): Yes  [facility] reviewed this encounter (include red flags):  Reviewed, no red flags -Follow-up in 3 months - DRUG MONITORING, PANEL 8 WITH CONFIRMATION, URINE - oxyCODONE-acetaminophen (PERCOCET) 10-325 MG tablet; Take 1 tablet by mouth every 8 (eight) hours as needed for pain.  Dispense: 90 tablet; Refill: 0 - oxyCODONE-acetaminophen (PERCOCET) 10-325 MG tablet; Take 1 tablet by mouth every 8 (eight) hours as needed for pain.  Dispense: 90 tablet; Refill: 0 - oxyCODONE-acetaminophen (PERCOCET) 10-325 MG tablet; Take 1 tablet by mouth every 8 (eight) hours as needed for pain.  Dispense: 90 tablet; Refill: 0  6. Chronic midline low back pain with left-sided sciatica - oxyCODONE-acetaminophen (PERCOCET) 10-325 MG tablet; Take 1 tablet by mouth every 8 (eight) hours as needed for pain.  Dispense: 90 tablet; Refill: 0 - oxyCODONE-acetaminophen (PERCOCET) 10-325 MG tablet; Take 1 tablet by mouth every 8 (eight) hours as needed for pain.  Dispense: 90 tablet; Refill: 0 - oxyCODONE-acetaminophen (PERCOCET) 10-325 MG tablet; Take 1 tablet by mouth every 8 (eight) hours as needed for pain.   Dispense: 90 tablet; Refill: 0  7. Lumbar post-laminectomy syndrome - oxyCODONE-acetaminophen (PERCOCET) 10-325 MG tablet; Take 1 tablet by mouth every 8 (eight) hours as needed for pain.  Dispense: 90 tablet; Refill: 0 - oxyCODONE-acetaminophen (PERCOCET) 10-325 MG tablet; Take 1 tablet by mouth every 8 (eight) hours as needed for pain.  Dispense: 90 tablet; Refill: 0 - oxyCODONE-acetaminophen (PERCOCET) 10-325 MG tablet; Take 1 tablet by mouth every 8 (eight) hours as needed for pain.  Dispense: 90 tablet; Refill: 0   Olean Ree, FNP-BC  [facility], [facility]  03/18/2020 8:56 AM

## 2020-03-17 NOTE — Patient Instructions (Signed)
Good to see you today  Please schedule a follow up in 3 months  You will get a call for your endocrine appointment

## 2020-03-18 MED ORDER — OXYCODONE-ACETAMINOPHEN 10-325 MG PO TABS: 1.0000 | ORAL_TABLET | Freq: Three times a day (TID) | ORAL | 0 refills | Status: DC | PRN

## 2020-03-18 MED ORDER — ALPRAZOLAM 0.5 MG PO TABS: 0.5000 mg | ORAL_TABLET | Freq: Three times a day (TID) | ORAL | 1 refills | Status: DC | PRN

## 2020-03-22 LAB — DRUG MONITORING, PANEL 8 WITH CONFIRMATION, URINE
6 Acetylmorphine: NEGATIVE ng/mL (ref <10)
Alcohol Metabolites: NEGATIVE ng/mL
Amphetamines: NEGATIVE ng/mL (ref <500)
Benzodiazepines: NEGATIVE ng/mL (ref <100)
Buprenorphine, Urine: NEGATIVE ng/mL (ref <5)
Cocaine Metabolite: NEGATIVE ng/mL (ref <150)
Codeine: NEGATIVE ng/mL (ref <50)
Creatinine: 30.3 mg/dL
Hydrocodone: NEGATIVE ng/mL (ref <50)
Hydromorphone: NEGATIVE ng/mL (ref <50)
MDMA: NEGATIVE ng/mL (ref <500)
Marijuana Metabolite: 50 ng/mL — ABNORMAL HIGH (ref <5)
Marijuana Metabolite: POSITIVE ng/mL — AB (ref <20)
Morphine: NEGATIVE ng/mL (ref <50)
Norhydrocodone: NEGATIVE ng/mL (ref <50)
Noroxycodone: 706 ng/mL — ABNORMAL HIGH (ref <50)
Opiates: NEGATIVE ng/mL (ref <100)
Oxidant: NEGATIVE ug/mL
Oxycodone: 524 ng/mL — ABNORMAL HIGH (ref <50)
Oxycodone: POSITIVE ng/mL — AB (ref <100)
Oxymorphone: 216 ng/mL — ABNORMAL HIGH (ref <50)
pH: 5.7 (ref 4.5–9.0)

## 2020-03-22 LAB — DM TEMPLATE

## 2020-03-29 ENCOUNTER — Encounter: Payer: Self-pay | Admitting: Family Medicine

## 2020-03-29 DIAGNOSIS — F411 Generalized anxiety disorder: Secondary | ICD-10-CM | POA: Insufficient documentation

## 2020-04-07 ENCOUNTER — Other Ambulatory Visit: Payer: Self-pay | Admitting: Family Medicine

## 2020-04-07 DIAGNOSIS — Z794 Long term (current) use of insulin: Secondary | ICD-10-CM

## 2020-04-07 DIAGNOSIS — E1165 Type 2 diabetes mellitus with hyperglycemia: Secondary | ICD-10-CM

## 2020-04-21 ENCOUNTER — Other Ambulatory Visit: Payer: Self-pay | Admitting: Family Medicine

## 2020-04-21 DIAGNOSIS — Z794 Long term (current) use of insulin: Secondary | ICD-10-CM

## 2020-05-11 DIAGNOSIS — R809 Proteinuria, unspecified: Secondary | ICD-10-CM | POA: Diagnosis not present

## 2020-05-11 DIAGNOSIS — E1129 Type 2 diabetes mellitus with other diabetic kidney complication: Secondary | ICD-10-CM | POA: Diagnosis not present

## 2020-05-11 DIAGNOSIS — E1169 Type 2 diabetes mellitus with other specified complication: Secondary | ICD-10-CM | POA: Diagnosis not present

## 2020-05-11 DIAGNOSIS — E1159 Type 2 diabetes mellitus with other circulatory complications: Secondary | ICD-10-CM | POA: Diagnosis not present

## 2020-05-11 DIAGNOSIS — I152 Hypertension secondary to endocrine disorders: Secondary | ICD-10-CM | POA: Diagnosis not present

## 2020-05-17 ENCOUNTER — Other Ambulatory Visit: Payer: Self-pay | Admitting: Family Medicine

## 2020-05-17 DIAGNOSIS — F32A Depression, unspecified: Secondary | ICD-10-CM

## 2020-05-17 DIAGNOSIS — G4700 Insomnia, unspecified: Secondary | ICD-10-CM

## 2020-05-17 DIAGNOSIS — E1165 Type 2 diabetes mellitus with hyperglycemia: Secondary | ICD-10-CM

## 2020-05-17 DIAGNOSIS — I10 Essential (primary) hypertension: Secondary | ICD-10-CM

## 2020-05-17 DIAGNOSIS — F419 Anxiety disorder, unspecified: Secondary | ICD-10-CM

## 2020-05-17 DIAGNOSIS — Z794 Long term (current) use of insulin: Secondary | ICD-10-CM

## 2020-05-19 NOTE — Telephone Encounter (Signed)
Will you please call patient and see if he will reschedule to see me before my last day?

## 2020-05-20 ENCOUNTER — Other Ambulatory Visit: Payer: Self-pay | Admitting: Family Medicine

## 2020-05-20 DIAGNOSIS — M961 Postlaminectomy syndrome, not elsewhere classified: Secondary | ICD-10-CM

## 2020-06-12 ENCOUNTER — Telehealth: Payer: Self-pay | Admitting: Family Medicine

## 2020-06-12 NOTE — Telephone Encounter (Signed)
Called pt and no answer.  

## 2020-06-13 NOTE — Telephone Encounter (Signed)
Thanks for trying.  Please try to check with patient in the next few days.

## 2020-06-19 ENCOUNTER — Ambulatory Visit: Payer: Medicare Other | Admitting: Family Medicine

## 2020-06-19 ENCOUNTER — Other Ambulatory Visit: Payer: Self-pay

## 2020-06-19 DIAGNOSIS — F32A Depression, unspecified: Secondary | ICD-10-CM

## 2020-06-19 DIAGNOSIS — G8929 Other chronic pain: Secondary | ICD-10-CM

## 2020-06-19 DIAGNOSIS — F1190 Opioid use, unspecified, uncomplicated: Secondary | ICD-10-CM

## 2020-06-19 DIAGNOSIS — M961 Postlaminectomy syndrome, not elsewhere classified: Secondary | ICD-10-CM

## 2020-06-19 MED ORDER — OXYCODONE-ACETAMINOPHEN 10-325 MG PO TABS: 1.0000 | ORAL_TABLET | Freq: Three times a day (TID) | ORAL | 0 refills | Status: DC | PRN

## 2020-06-19 MED ORDER — ALPRAZOLAM 0.5 MG PO TABS: ORAL_TABLET | ORAL | 0 refills | Status: DC

## 2020-06-19 NOTE — Telephone Encounter (Signed)
Px filled Routing to Dr Para March for [employer]

## 2020-06-19 NOTE — Telephone Encounter (Signed)
Patient advised.

## 2020-06-19 NOTE — Telephone Encounter (Signed)
Thank you :)

## 2020-06-19 NOTE — Telephone Encounter (Signed)
Patient was scheduled to see Dr Para March today for [facility] from [facility] but had to reschedule patient till next Friday 1/21 due to Dr Para March is out of the office. Patient states he needs his medication refills due to he ran out of both on 06/18/19.  Alprazolam 0.5 mg- Last filled on 05/19/20 #90 with 0 refill.   Oxycodone-Acetaminophen- Last filled on 03/18/20 x 3 RXs. # 90 with 0 refill for each.  Pharmacy: [facility].  LOV with [facility] was on 03/17/2020.  Last UDS was on 03/17/20. Last Contract was on 03/04/2019

## 2020-06-30 ENCOUNTER — Telehealth: Payer: Medicare Other | Admitting: Family Medicine

## 2020-06-30 ENCOUNTER — Telehealth (INDEPENDENT_AMBULATORY_CARE_PROVIDER_SITE_OTHER): Payer: Medicare Other | Admitting: Family Medicine

## 2020-06-30 ENCOUNTER — Ambulatory Visit: Payer: Medicare Other | Admitting: Family Medicine

## 2020-06-30 ENCOUNTER — Encounter: Payer: Self-pay | Admitting: Family Medicine

## 2020-06-30 VITALS — Ht 72.5 in | Wt 252.0 lb

## 2020-06-30 DIAGNOSIS — G8929 Other chronic pain: Secondary | ICD-10-CM

## 2020-06-30 DIAGNOSIS — E1165 Type 2 diabetes mellitus with hyperglycemia: Secondary | ICD-10-CM

## 2020-06-30 DIAGNOSIS — M545 Low back pain, unspecified: Secondary | ICD-10-CM
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
## 2020-06-30 NOTE — Progress Notes (Signed)
Patient is doing TOC from [place] to Dr. Para March today. No concerns for todays visit. Patient has had a DM eye exam last spring with [facility]; will request exam today.

## 2020-07-02 NOTE — Assessment & Plan Note (Signed)
He'll need pain med refill 2/10.  [facility].  He'll need refills next month.  I can work on that later on, discussed with patient.  We talked about his chronic pain.  He has been through multiple types of treatment with surgery physical therapy and medical treatment.  He doesn't have any significant adverse effect with his current medications.  He is not sedated.  Taking oxycodone allows him to be more mobile and it likely improves his quality of life in terms of relative pain reduction.  We talked about his previous UDS and he stopped using any THC containing Gummies.  At this point I think the benefit outweighs the risk with continued opiate use and I would continue as is.  He agrees.  He'll update me as needed.

## 2020-07-02 NOTE — Assessment & Plan Note (Signed)
Per endocrinology.  I'll defer.  He agrees.

## 2020-07-18 ENCOUNTER — Telehealth: Payer: Self-pay

## 2020-07-18 DIAGNOSIS — E1165 Type 2 diabetes mellitus with hyperglycemia: Secondary | ICD-10-CM

## 2020-07-18 DIAGNOSIS — Z794 Long term (current) use of insulin: Secondary | ICD-10-CM

## 2020-07-18 MED ORDER — METFORMIN HCL ER 500 MG PO TB24: ORAL_TABLET | ORAL | 2 refills | Status: DC

## 2020-07-18 NOTE — Telephone Encounter (Signed)
Pharmacy requests refill on: Metformin HCL ER 500 mg   LAST REFILL: 04/21/2020 (Q-120, R-2) LAST OV: 06/30/2020 NEXT OV: Not Scheduled  PHARMACY: [facility]  Hgb A1C (12/15/2019)

## 2020-07-19 ENCOUNTER — Other Ambulatory Visit: Payer: Self-pay | Admitting: Family Medicine

## 2020-07-19 DIAGNOSIS — G8929 Other chronic pain: Secondary | ICD-10-CM

## 2020-07-19 DIAGNOSIS — F419 Anxiety disorder, unspecified: Secondary | ICD-10-CM

## 2020-07-19 DIAGNOSIS — F1190 Opioid use, unspecified, uncomplicated: Secondary | ICD-10-CM

## 2020-07-19 DIAGNOSIS — M961 Postlaminectomy syndrome, not elsewhere classified: Secondary | ICD-10-CM

## 2020-07-19 DIAGNOSIS — F32A Depression, unspecified: Secondary | ICD-10-CM

## 2020-07-19 MED ORDER — ALPRAZOLAM 0.5 MG PO TABS: ORAL_TABLET | ORAL | 2 refills | Status: DC

## 2020-07-19 MED ORDER — OXYCODONE-ACETAMINOPHEN 10-325 MG PO TABS: 1.0000 | ORAL_TABLET | Freq: Three times a day (TID) | ORAL | 0 refills | Status: DC | PRN

## 2020-07-19 NOTE — Progress Notes (Signed)
Left patient a message that his rxs have been sent to the pharmacy.

## 2020-07-19 NOTE — Progress Notes (Signed)
Prescription sent. Please notify patient.

## 2020-10-05 ENCOUNTER — Telehealth: Payer: Self-pay

## 2020-10-05 DIAGNOSIS — E1165 Type 2 diabetes mellitus with hyperglycemia: Secondary | ICD-10-CM

## 2020-10-05 DIAGNOSIS — Z794 Long term (current) use of insulin: Secondary | ICD-10-CM

## 2020-10-05 MED ORDER — GLIPIZIDE 10 MG PO TABS
ORAL_TABLET | ORAL | 0 refills | Status: DC
Start: 2020-10-05 — End: 2021-01-03

## 2020-10-05 NOTE — Telephone Encounter (Signed)
Pharmacy requests refill on: Glipizide 10 mg   LAST REFILL: 04/10/2020 (Q-180, R-1) LAST OV: 03/17/2020 NEXT OV: Not Scheduled  PHARMACY: [facility]  Hgb A1C (08/16/2020): 11.8

## 2020-10-17 ENCOUNTER — Telehealth: Payer: Self-pay | Admitting: Family Medicine

## 2020-10-17 DIAGNOSIS — E1165 Type 2 diabetes mellitus with hyperglycemia: Secondary | ICD-10-CM

## 2020-10-17 DIAGNOSIS — F419 Anxiety disorder, unspecified: Secondary | ICD-10-CM

## 2020-10-17 DIAGNOSIS — G8929 Other chronic pain: Secondary | ICD-10-CM

## 2020-10-17 DIAGNOSIS — F32A Depression, unspecified: Secondary | ICD-10-CM

## 2020-10-17 DIAGNOSIS — M961 Postlaminectomy syndrome, not elsewhere classified: Secondary | ICD-10-CM

## 2020-10-17 DIAGNOSIS — F1190 Opioid use, unspecified, uncomplicated: Secondary | ICD-10-CM

## 2020-10-18 NOTE — Telephone Encounter (Signed)
Sent. Thanks.  Needs OV scheduled when possible.

## 2020-10-18 NOTE — Telephone Encounter (Signed)
Patient due for OV - please call to schedule 

## 2020-10-18 NOTE — Telephone Encounter (Signed)
Refill request for oxyCODONE-acetaminophen (PERCOCET) 10-325 MG tablets and xanax 0.5mg  tablets.   LOV - 06/30/20 Next OV - not scheduled Last refill - 07/19/20 #90/2 xanax and 07/19/20 #90/0 to fill on or after 09/15/20 for oxycodone  Patient also needs metformin refilled.

## 2020-10-25 NOTE — Telephone Encounter (Signed)
LVMTCB

## 2020-10-27 NOTE — Telephone Encounter (Signed)
Patient scheduled OV for 10/31/20 at 10am

## 2020-10-31 ENCOUNTER — Encounter: Payer: Self-pay | Admitting: Family Medicine

## 2020-10-31 ENCOUNTER — Other Ambulatory Visit: Payer: Self-pay

## 2020-10-31 ENCOUNTER — Ambulatory Visit (INDEPENDENT_AMBULATORY_CARE_PROVIDER_SITE_OTHER): Payer: Medicare Other | Admitting: Family Medicine

## 2020-10-31 DIAGNOSIS — E1165 Type 2 diabetes mellitus with hyperglycemia: Secondary | ICD-10-CM | POA: Diagnosis not present

## 2020-10-31 DIAGNOSIS — M545 Low back pain, unspecified: Secondary | ICD-10-CM

## 2020-10-31 DIAGNOSIS — Z794 Long term (current) use of insulin: Secondary | ICD-10-CM

## 2020-10-31 DIAGNOSIS — G8929 Other chronic pain: Secondary | ICD-10-CM

## 2020-10-31 MED ORDER — NOVOLIN 70/30 (70-30) 100 UNIT/ML ~~LOC~~ SUSP: 15.0000 [IU] | Freq: Two times a day (BID) | SUBCUTANEOUS | Status: DC
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
## 2020-11-01 NOTE — Assessment & Plan Note (Signed)
DM2 per endo with insulin change "and my numbers are coming down."  He feels better in the meantime.  D/w pt.  Now 150-175 on sugar checks.
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
## 2020-11-17 ENCOUNTER — Other Ambulatory Visit: Payer: Self-pay | Admitting: Family Medicine

## 2020-11-17 DIAGNOSIS — F1190 Opioid use, unspecified, uncomplicated: Secondary | ICD-10-CM

## 2020-11-17 DIAGNOSIS — M5442 Lumbago with sciatica, left side: Secondary | ICD-10-CM

## 2020-11-17 DIAGNOSIS — M961 Postlaminectomy syndrome, not elsewhere classified: Secondary | ICD-10-CM

## 2020-11-17 NOTE — Telephone Encounter (Signed)
Name of Medication: Oxycodone Name of Pharmacy: [facility] Last [place] or Written Date and Quantity: 10-18-20 #90 Last Office Visit and Type: 10-31-20 Next Office Visit and Type: No Future OV Last Controlled Substance Agreement Date: 03-04-19 Last UDS: 03-17-20

## 2020-11-18 ENCOUNTER — Other Ambulatory Visit: Payer: Self-pay | Admitting: Family Medicine

## 2020-11-18 DIAGNOSIS — G4700 Insomnia, unspecified: Secondary | ICD-10-CM

## 2020-11-19 MED ORDER — OXYCODONE-ACETAMINOPHEN 10-325 MG PO TABS: 1.0000 | ORAL_TABLET | Freq: Three times a day (TID) | ORAL | 0 refills | Status: DC | PRN

## 2020-11-19 MED ORDER — OXYCODONE-ACETAMINOPHEN 10-325 MG PO TABS
1.0000 | ORAL_TABLET | Freq: Three times a day (TID) | ORAL | 0 refills | Status: DC | PRN
Start: 2020-11-19 — End: 2021-02-20

## 2020-11-19 NOTE — Telephone Encounter (Signed)
Sent. Thanks.   

## 2020-11-20 ENCOUNTER — Other Ambulatory Visit: Payer: Self-pay

## 2020-11-20 ENCOUNTER — Ambulatory Visit (INDEPENDENT_AMBULATORY_CARE_PROVIDER_SITE_OTHER): Payer: Medicare Other

## 2020-11-20 DIAGNOSIS — Z0000 Encounter for general adult medical examination without abnormal findings: Secondary | ICD-10-CM

## 2020-11-20 NOTE — Progress Notes (Signed)
PCP notes:  Health Maintenance: COVID- declined Shingrix- due Eye exam- due    Abnormal Screenings: none   Patient concerns: none   Nurse concerns: none   Next PCP appt.: none

## 2020-11-20 NOTE — Telephone Encounter (Signed)
Sent. Thanks.   

## 2020-11-20 NOTE — Patient Instructions (Signed)
Kevin Huff , Thank you for taking time to come for your Medicare Wellness Visit. I appreciate your ongoing commitment to your health goals. Please review the following plan we discussed and let me know if I can assist you in the future.   Screening recommendations/referrals: Colonoscopy: cologuard completed 09/23/2019, due 09/2022 Recommended yearly ophthalmology/optometry visit for glaucoma screening and checkup Recommended yearly dental visit for hygiene and checkup  Vaccinations: Influenza vaccine: Up to date, completed 04/07/2020, due 01/2021 Pneumococcal vaccine: Up to date, completed 11/08/2017, due at age 30  Tdap vaccine: insurance Shingles vaccine: due, check with your insurance regarding coverage if interested   Covid-19: declined  Advanced directives: Please bring a copy of your POA (Power of [place]) and/or Living Will to your next appointment.   Conditions/risks identified: diabetes, hypertension  Next appointment: Follow up in one year for your annual wellness visit.   Preventive Care 67-80 Year Old, Male Preventive care refers to lifestyle choices and visits with your health care provider that can promote health and wellness. What does preventive care include? A yearly physical exam. This is also called an annual well check. Dental exams once or twice a year. Routine eye exams. Ask your health care provider how often you should have your eyes checked. Personal lifestyle choices, including: Daily care of your teeth and gums. Regular physical activity. Eating a healthy diet. Avoiding tobacco and drug use. Limiting alcohol use. Practicing safe sex. Taking low doses of aspirin every day. Taking vitamin and mineral supplements as recommended by your health care provider. What happens during an annual well check? The services and screenings done by your health care provider during your annual well check will depend on your age, overall health, lifestyle risk factors, and  family history of disease. Counseling  Your health care provider may ask you questions about your: Alcohol use. Tobacco use. Drug use. Emotional well-being. Home and relationship well-being. Sexual activity. Eating habits. History of falls. Memory and ability to understand (cognition). Work and work Astronomer. Screening  You may have the following tests or measurements: Height, weight, and BMI. Blood pressure. Lipid and cholesterol levels. These may be checked every 5 years, or more frequently if you are over 3 years old. Skin check. Lung cancer screening. You may have this screening every year starting at age 53 if you have a 30-pack-year history of smoking and currently smoke or have quit within the past 15 years. Fecal occult blood test (FOBT) of the stool. You may have this test every year starting at age 74. Flexible sigmoidoscopy or colonoscopy. You may have a sigmoidoscopy every 5 years or a colonoscopy every 10 years starting at age 21. Prostate cancer screening. Recommendations will vary depending on your family history and other risks. Hepatitis C blood test. Hepatitis B blood test. Sexually transmitted disease (STD) testing. Diabetes screening. This is done by checking your blood sugar (glucose) after you have not eaten for a while (fasting). You may have this done every 1-3 years. Abdominal aortic aneurysm (AAA) screening. You may need this if you are a current or former smoker. Osteoporosis. You may be screened starting at age 9 if you are at high risk. Talk with your health care provider about your test results, treatment options, and if necessary, the need for more tests. Vaccines  Your health care provider may recommend certain vaccines, such as: Influenza vaccine. This is recommended every year. Tetanus, diphtheria, and acellular pertussis (Tdap, Td) vaccine. You may need a Td booster every 10 years.  Zoster vaccine. You may need this after age 52. Pneumococcal  13-valent conjugate (PCV13) vaccine. One dose is recommended after age 71. Pneumococcal polysaccharide (PPSV23) vaccine. One dose is recommended after age 53. Talk to your health care provider about which screenings and vaccines you need and how often you need them. This information is not intended to replace advice given to you by your health care provider. Make sure you discuss any questions you have with your health care provider. Document Released: 06/23/2015 Document Revised: 02/14/2016 Document Reviewed: 03/28/2015 Elsevier Interactive Patient Education  2017 [place] Prevention in the Home Falls can cause injuries. They can happen to people of all ages. There are many things you can do to make your home safe and to help prevent falls. What can I do on the outside of my home? Regularly fix the edges of walkways and driveways and fix any cracks. Remove anything that might make you trip as you walk through a door, such as a raised step or threshold. Trim any bushes or trees on the path to your home. Use bright outdoor lighting. Clear any walking paths of anything that might make someone trip, such as rocks or tools. Regularly check to see if handrails are loose or broken. Make sure that both sides of any steps have handrails. Any raised decks and porches should have guardrails on the edges. Have any leaves, snow, or ice cleared regularly. Use sand or salt on walking paths during winter. Clean up any spills in your garage right away. This includes oil or grease spills. What can I do in the bathroom? Use night lights. Install grab bars by the toilet and in the tub and shower. Do not use towel bars as grab bars. Use non-skid mats or decals in the tub or shower. If you need to sit down in the shower, use a plastic, non-slip stool. Keep the floor dry. Clean up any water that spills on the floor as soon as it happens. Remove soap buildup in the tub or shower regularly. Attach  bath mats securely with double-sided non-slip rug tape. Do not have throw rugs and other things on the floor that can make you trip. What can I do in the bedroom? Use night lights. Make sure that you have a light by your bed that is easy to reach. Do not use any sheets or blankets that are too big for your bed. They should not hang down onto the floor. Have a firm chair that has side arms. You can use this for support while you get dressed. Do not have throw rugs and other things on the floor that can make you trip. What can I do in the kitchen? Clean up any spills right away. Avoid walking on wet floors. Keep items that you use a lot in easy-to-reach places. If you need to reach something above you, use a strong step stool that has a grab bar. Keep electrical cords out of the way. Do not use floor polish or wax that makes floors slippery. If you must use wax, use non-skid floor wax. Do not have throw rugs and other things on the floor that can make you trip. What can I do with my stairs? Do not leave any items on the stairs. Make sure that there are handrails on both sides of the stairs and use them. Fix handrails that are broken or loose. Make sure that handrails are as long as the stairways. Check any carpeting to make sure that  it is firmly attached to the stairs. Fix any carpet that is loose or worn. Avoid having throw rugs at the top or bottom of the stairs. If you do have throw rugs, attach them to the floor with carpet tape. Make sure that you have a light switch at the top of the stairs and the bottom of the stairs. If you do not have them, ask someone to add them for you. What else can I do to help prevent falls? Wear shoes that: Do not have high heels. Have rubber bottoms. Are comfortable and fit you well. Are closed at the toe. Do not wear sandals. If you use a stepladder: Make sure that it is fully opened. Do not climb a closed stepladder. Make sure that both sides of the  stepladder are locked into place. Ask someone to hold it for you, if possible. Clearly mark and make sure that you can see: Any grab bars or handrails. First and last steps. Where the edge of each step is. Use tools that help you move around (mobility aids) if they are needed. These include: Canes. Walkers. Scooters. Crutches. Turn on the lights when you go into a dark area. Replace any light bulbs as soon as they burn out. Set up your furniture so you have a clear path. Avoid moving your furniture around. If any of your floors are uneven, fix them. If there are any pets around you, be aware of where they are. Review your medicines with your doctor. Some medicines can make you feel dizzy. This can increase your chance of falling. Ask your doctor what other things that you can do to help prevent falls. This information is not intended to replace advice given to you by your health care provider. Make sure you discuss any questions you have with your health care provider. Document Released: 03/23/2009 Document Revised: 11/02/2015 Document Reviewed: 07/01/2014 Elsevier Interactive Patient Education  2017 [employer].

## 2020-11-20 NOTE — Telephone Encounter (Signed)
Pharmacy calling for refill.   Please advise, thanks.

## 2020-11-20 NOTE — Progress Notes (Signed)
Subjective:   Kevin Huff is a 55 y.o. male who presents for Medicare Annual/Subsequent preventive examination.  Review of Systems: N/A      I connected with the patient today by telephone and verified that I am speaking with the correct person using two identifiers. Location patient: home Location nurse: work Persons participating in the telephone visit: patient, nurse.   I discussed the limitations, risks, security and privacy concerns of performing an evaluation and management service by telephone and the availability of in person appointments. I also discussed with the patient that there may be a patient responsible charge related to this service. The patient expressed understanding and verbally consented to this telephonic visit.        Cardiac Risk Factors include: advanced age (>36mn, >>63women);diabetes mellitus;hypertension;male gender     Objective:    Today's Vitals   There is no height or weight on file to calculate BMI.  Advanced Directives 11/20/2020 09/09/2019 09/05/2015 07/22/2015  Does Patient Have a Medical Advance Directive? Yes Yes Yes No  Type of AParamedicof [place]Living will [place]ort Den[place] will Living will -  Copy of HFraserin Chart? No - copy requested No - copy requested - -  Would patient like information on creating a medical advance directive? - - - No - patient declined information    Current Medications (verified) Outpatient Encounter Medications as of 11/20/2020  Medication Sig   ALPRAZolam (XANAX) 0.5 MG tablet TAKE 1 TABLET BY MOUTH 3 TIMES DAILY AS NEEDED FOR ANXIETY   Blood Glucose Monitoring Suppl (ONETOUCH VERIO) w/Device KIT 1 Units by Does not apply route 3 (three) times daily.   Fexofenadine HCl (ALLEGRA PO) Take by mouth daily.   glipiZIDE (GLUCOTROL) 10 MG tablet TAKE 1 TABLET BY MOUTH TWICE A DAY BEFORE A MEAL   glucose blood (ONETOUCH VERIO) test strip Use as  instructed   insulin NPH-regular Human (NOVOLIN 70/30) (70-30) 100 UNIT/ML injection Inject 15 Units into the skin 2 (two) times daily with a meal.   lisinopril (ZESTRIL) 20 MG tablet TAKE 1 TABLET BY MOUTH DAILY   meloxicam (MOBIC) 15 MG tablet TAKE 1 TABLET BY MOUTH DAILY   metFORMIN (GLUCOPHAGE-XR) 500 MG 24 hr tablet TAKE 2 TABLETS BY MOUTH TWICE A DAY   oxyCODONE-acetaminophen (PERCOCET) 10-325 MG tablet TAKE 1 TABLET BY MOUTH EVERY 8 HOURS AS NEEDED FOR PAIN   oxyCODONE-acetaminophen (PERCOCET) 10-325 MG tablet Take 1 tablet by mouth every 8 (eight) hours as needed. for pain   oxyCODONE-acetaminophen (PERCOCET) 10-325 MG tablet Take 1 tablet by mouth every 8 (eight) hours as needed for pain.   pioglitazone (ACTOS) 15 MG tablet Take by mouth.   traZODone (DESYREL) 100 MG tablet TAKE 2 TABLETS BY MOUTH AT BEDTIME AS NEEDED FOR SLEEP   No facility-administered encounter medications on file as of 11/20/2020.    Allergies (verified) Duloxetine, Codeine, Sertraline hcl, and Penicillins   History: Past Medical History:  Diagnosis Date   Chronic back pain greater than 3 months duration    Depression    Diabetes mellitus without complication ([place]    Hypertension    Past Surgical History:  Procedure Laterality Date   BACK SURGERY     KNEE SURGERY     Family History  Problem Relation Age of Onset   Cancer Mother    Hyperlipidemia Father    Hypertension Father    Diabetes Father    Heart disease Father  Social History   Socioeconomic History   Marital status: Married    Spouse name: Not on file   Number of children: Not on file   Years of education: Not on file   Highest education level: Not on file  Occupational History   Not on file  Tobacco Use   Smoking status: Never   Smokeless tobacco: Never  Substance and Sexual Activity   Alcohol use: No   Drug use: Never   Sexual activity: Yes    Partners: Female  Other Topics Concern   Not on file  Social History  Narrative   From [place] Resource Strain: Low Risk    Difficulty of Paying Living Expenses: Not hard at all  Food Insecurity: No Food Insecurity   Worried About Charity fundraiser in the Last Year: Never true   Arboriculturist in the Last Year: Never true  Transportation Needs: No Transportation Needs   Lack of Transportation (Medical): No   Lack of Transportation (Non-Medical): No  Physical Activity: Inactive   Days of Exercise per Week: 0 days   Minutes of Exercise per Session: 0 min  Stress: No Stress Concern Present   Feeling of Stress : Not at all  Social Connections: Not on file    Tobacco Counseling Counseling given: Not Answered   Clinical Intake:  Pre-visit preparation completed: Yes  Pain : 0-10 Pain Type: Chronic pain Pain Location: Back Pain Orientation: Lower Pain Descriptors / Indicators: Aching Pain Onset: More than a month ago Pain Frequency: Intermittent     Nutritional Risks: None Diabetes: Yes CBG done?: No Did pt. bring in CBG monitor from home?: No  How often do you need to have someone help you when you read instructions, pamphlets, or other written materials from your doctor or pharmacy?: 1 - Never  Diabetic: Yes Nutrition Risk Assessment:  Has the patient had any N/V/D within the last 2 months?  No  Does the patient have any non-healing wounds?  No  Has the patient had any unintentional weight loss or weight gain?  No   Diabetes:  Is the patient diabetic?  Yes  If diabetic, was a CBG obtained today?  No  telephone visit  Did the patient bring in their glucometer from home?  No  telephone visit  How often do you monitor your CBG's? daily.   Financial Strains and Diabetes Management:  Are you having any financial strains with the device, your supplies or your medication? No .  Does the patient want to be seen by Chronic Care Management for management of their diabetes?  No   Would the patient like to be referred to a Nutritionist or for Diabetic Management?  No   Diabetic Exams:  Diabetic Eye Exam: Overdue for diabetic eye exam. Pt has been advised about the importance in completing this exam. Patient advised to call and schedule an eye exam. Diabetic Foot Exam: Completed 03/17/2020          Activities of Daily Living In your present state of health, do you have any difficulty performing the following activities: 11/20/2020  Hearing? N  Vision? N  Difficulty concentrating or making decisions? N  Walking or climbing stairs? N  Dressing or bathing? N  Doing errands, shopping? N  Preparing Food and eating ? N  Using the Toilet? N  In the past six months, have you accidently leaked urine? N  Do you  have problems with loss of bowel control? N  Managing your Medications? N  Managing your Finances? N  Housekeeping or managing your Housekeeping? N  Some recent data might be hidden    Patient Care Team: Tonia Ghent, MD as PCP - General (Family Medicine)  Indicate any recent Medical Services you may have received from other than [facility] providers in the past year (date may be approximate).     Assessment:   This is a routine wellness examination for Miklo.  Hearing/Vision screen Vision Screening - Comments:: Patient gets annual eye exams   Dietary issues and exercise activities discussed: Current Exercise Habits: The patient does not participate in regular exercise at present, Exercise limited by: None identified   Goals Addressed             This Visit's Progress    Patient Stated       11/20/2020, I will maintain and continue medications as prescribed.         Depression Screen PHQ 2/9 Scores 11/20/2020 10/31/2020 09/09/2019 03/01/2019 11/26/2017  PHQ - 2 Score 0 0 0 0 2  PHQ- 9 Score 0 0 0 1 -    Fall Risk Fall Risk  11/20/2020 10/31/2020 09/09/2019  Falls in the past year? 1 1 1   Comment - - tripped and fell  Number falls in past  yr: 0 0 1  Injury with Fall? 1 1 0  Risk for fall due to : Medication side effect;Impaired balance/gait History of fall(s) History of fall(s);Impaired balance/gait;Impaired mobility;Medication side effect  Follow up Falls evaluation completed;Falls prevention discussed Falls evaluation completed Falls evaluation completed;Falls prevention discussed    FALL RISK PREVENTION PERTAINING TO THE HOME:  Any stairs in or around the home? Yes  If so, are there any without handrails? No  Home free of loose throw rugs in walkways, pet beds, electrical cords, etc? Yes  Adequate lighting in your home to reduce risk of falls? Yes   ASSISTIVE DEVICES UTILIZED TO PREVENT FALLS:  Life alert? No  Use of a cane, walker or w/c? Yes  Grab bars in the bathroom? No  Shower chair or bench in shower? No  Elevated toilet seat or a handicapped toilet? No   TIMED UP AND GO:  Was the test performed?  N/A telephone visit  .   Cognitive Function: MMSE - Mini Mental State Exam 11/20/2020 09/09/2019  Not completed: Refused -  Orientation to time - 5  Orientation to Place - 5  Registration - 3  Attention/ Calculation - 5  Recall - 3  Language- repeat - 1  Mini Cog  Mini-Cog screen was not completed. Patient refused. Maximum score is 22. A value of 0 denotes this part of the MMSE was not completed or the patient failed this part of the Mini-Cog screening.       Immunizations Immunization History  Administered Date(s) Administered   Fluad Quad(high Dose 65+) 05/11/2019   Influenza,inj,Quad PF,6+ Mos 02/27/2018, 04/07/2020   Pneumococcal Polysaccharide-23 11/08/2017    TDAP status: Due, Education has been provided regarding the importance of this vaccine. Advised may receive this vaccine at local pharmacy or Health Dept. Aware to provide a copy of the vaccination record if obtained from local pharmacy or Health Dept. Verbalized acceptance and understanding.  Flu Vaccine status: Up to date  Pneumococcal  vaccine status: Up to date  Covid-19 vaccine status: Declined, Education has been provided regarding the importance of this vaccine but patient still declined. Advised may receive  this vaccine at local pharmacy or Health Dept.or vaccine clinic. Aware to provide a copy of the vaccination record if obtained from local pharmacy or Health Dept. Verbalized acceptance and understanding.  Qualifies for Shingles Vaccine? Yes   Zostavax completed No   Shingrix Completed?: No.    Education has been provided regarding the importance of this vaccine. Patient has been advised to call insurance company to determine out of pocket expense if they have not yet received this vaccine. Advised may also receive vaccine at local pharmacy or Health Dept. Verbalized acceptance and understanding.  Screening Tests Health Maintenance  Topic Date Due   COVID-19 Vaccine (1) Never done   Zoster Vaccines- Shingrix (1 of 2) Never done   OPHTHALMOLOGY EXAM  08/25/2020   HEMOGLOBIN A1C  09/15/2020   TETANUS/TDAP  09/08/2024 (Originally 08/26[birth year]1982)   INFLUENZA VACCINE  01/08/2021   FOOT EXAM  03/17/2021   Fecal DNA (Cologuard)  09/23/2022   PNEUMOCOCCAL POLYSACCHARIDE VACCINE AGE 13-64 HIGH RISK  Completed   Hepatitis C Screening  Completed   HIV Screening  Completed   Pneumococcal Vaccine 65-71 Years old  Aged Out   HPV VACCINES  Aged Out    Health Maintenance  Health Maintenance Due  Topic Date Due   COVID-19 Vaccine (1) Never done   Zoster Vaccines- Shingrix (1 of 2) Never done   OPHTHALMOLOGY EXAM  08/25/2020   HEMOGLOBIN A1C  09/15/2020    Colorectal cancer screening: Type of screening: Cologuard. Completed 09/23/2019. Repeat every 3 years  Lung Cancer Screening: (Low Dose CT Chest recommended if Age 77-80 years, 30 pack-year currently smoking OR have quit w/in 15years.) does not qualify.     Additional Screening:  Hepatitis C Screening: does qualify; Completed 02/27/2018  Vision Screening: Recommended  annual ophthalmology exams for early detection of glaucoma and other disorders of the eye. Is the patient up to date with their annual eye exam?  Yes  Who is the provider or what is the name of the office in which the patient attends annual eye exams? [facility] If pt is not established with a provider, would they like to be referred to a provider to establish care? No .   Dental Screening: Recommended annual dental exams for proper oral hygiene  Community Resource Referral / Chronic Care Management: CRR required this visit?  No   CCM required this visit?  No      Plan:     I have personally reviewed and noted the following in the patient's chart:   Medical and social history Use of alcohol, tobacco or illicit drugs  Current medications and supplements including opioid prescriptions. Patient is currently taking opioid prescriptions. Information provided to patient regarding non-opioid alternatives. Patient advised to discuss non-opioid treatment plan with their provider. Functional ability and status Nutritional status Physical activity Advanced directives List of other physicians Hospitalizations, surgeries, and ER visits in previous 12 months Vitals Screenings to include cognitive, depression, and falls Referrals and appointments  In addition, I have reviewed and discussed with patient certain preventive protocols, quality metrics, and best practice recommendations. A written personalized care plan for preventive services as well as general preventive health recommendations were provided to patient.   Due to this being a telephonic visit, the after visit summary with patients personalized plan was offered to patient via office or my-chart. Patient preferred to pick up at office at next visit or via mychart.   Andrez Grime, LPN   [id number]/52/0802

## 2021-01-01 ENCOUNTER — Other Ambulatory Visit: Payer: Self-pay | Admitting: Family Medicine

## 2021-01-02 ENCOUNTER — Telehealth: Payer: Self-pay | Admitting: Family Medicine

## 2021-01-02 ENCOUNTER — Other Ambulatory Visit: Payer: Self-pay

## 2021-01-02 NOTE — Telephone Encounter (Signed)
Kevin Huff called in wanted to know about a prescription for naproxen, when he talked to Dr. Para March the last time he told him about a fall and  he went to [facility] from a fall and he put him on and the put him on naproxen , and he wanted to know if the Dr. Althea Charon at [facility] sent over the notes from the office visit and not sure if he needs to continue the medication.

## 2021-01-02 NOTE — Telephone Encounter (Signed)
Patient states he stopped taking the meloxicam when he was started on the naproxen. He takes 500 mg BID of naproxen.

## 2021-01-02 NOTE — Telephone Encounter (Signed)
Wouldn't take meloxicam and naproxen concurrently.  Please verify that with patient, verify the current naproxen dose, and let me know.  Thanks.

## 2021-01-03 ENCOUNTER — Other Ambulatory Visit: Payer: Self-pay | Admitting: Family Medicine

## 2021-01-03 DIAGNOSIS — Z794 Long term (current) use of insulin: Secondary | ICD-10-CM

## 2021-01-03 DIAGNOSIS — E1165 Type 2 diabetes mellitus with hyperglycemia: Secondary | ICD-10-CM

## 2021-01-03 MED ORDER — NAPROXEN 500 MG PO TABS: 500.0000 mg | ORAL_TABLET | Freq: Two times a day (BID) | ORAL | 2 refills | Status: DC | PRN

## 2021-01-03 NOTE — Telephone Encounter (Signed)
Sent. Thanks.   

## 2021-01-03 NOTE — Addendum Note (Signed)
Addended by: Joaquim Nam on: 01/03/2021 03:39 PM   Modules accepted: Orders

## 2021-01-15 ENCOUNTER — Other Ambulatory Visit: Payer: Self-pay | Admitting: Family Medicine

## 2021-01-15 DIAGNOSIS — E1165 Type 2 diabetes mellitus with hyperglycemia: Secondary | ICD-10-CM

## 2021-01-15 DIAGNOSIS — Z794 Long term (current) use of insulin: Secondary | ICD-10-CM

## 2021-01-16 ENCOUNTER — Other Ambulatory Visit: Payer: Self-pay | Admitting: Family Medicine

## 2021-01-16 DIAGNOSIS — F32A Depression, unspecified: Secondary | ICD-10-CM

## 2021-01-16 DIAGNOSIS — F419 Anxiety disorder, unspecified: Secondary | ICD-10-CM

## 2021-01-17 NOTE — Telephone Encounter (Signed)
Sent. Thanks.   

## 2021-01-17 NOTE — Telephone Encounter (Signed)
Refill request for Alprazolam 0.5 mg tablets  LOV - 10/31/20 Next OV - not scheduled Last refill - 10/18/20 #90/2

## 2021-02-13 ENCOUNTER — Other Ambulatory Visit: Payer: Self-pay | Admitting: Family Medicine

## 2021-02-13 DIAGNOSIS — M5442 Lumbago with sciatica, left side: Secondary | ICD-10-CM

## 2021-02-13 DIAGNOSIS — F1190 Opioid use, unspecified, uncomplicated: Secondary | ICD-10-CM

## 2021-02-13 DIAGNOSIS — M961 Postlaminectomy syndrome, not elsewhere classified: Secondary | ICD-10-CM

## 2021-02-13 DIAGNOSIS — G8929 Other chronic pain: Secondary | ICD-10-CM

## 2021-02-19 ENCOUNTER — Other Ambulatory Visit: Payer: Self-pay | Admitting: Family Medicine

## 2021-02-19 DIAGNOSIS — G8929 Other chronic pain: Secondary | ICD-10-CM

## 2021-02-19 DIAGNOSIS — M961 Postlaminectomy syndrome, not elsewhere classified: Secondary | ICD-10-CM

## 2021-02-19 DIAGNOSIS — F1190 Opioid use, unspecified, uncomplicated: Secondary | ICD-10-CM

## 2021-02-20 MED ORDER — OXYCODONE-ACETAMINOPHEN 10-325 MG PO TABS: 1.0000 | ORAL_TABLET | Freq: Three times a day (TID) | ORAL | 0 refills | Status: DC | PRN

## 2021-02-20 NOTE — Telephone Encounter (Signed)
This is a duplicate request from 02/13/21 but im not able to close it out. Thank you

## 2021-02-20 NOTE — Telephone Encounter (Signed)
Refill request for Oxycodone-acetaminophen  LOV - 10/31/20 Next OV - 02/22/21 Last refill - 11/19/20 #90/0

## 2021-02-20 NOTE — Telephone Encounter (Signed)
Patient called in requesting status on refills stated its been 3 days with out medication

## 2021-02-20 NOTE — Telephone Encounter (Signed)
Please check with patient.  I sent the rx for 3 months.  This was the first time I had seen the request.  I didn't mean for him to have days w/o medicine.  Please check with to see when it was requested, etc and let me know.  Thanks.

## 2021-02-22 ENCOUNTER — Other Ambulatory Visit: Payer: Self-pay

## 2021-02-22 ENCOUNTER — Encounter: Payer: Self-pay | Admitting: Family Medicine

## 2021-02-22 ENCOUNTER — Telehealth (INDEPENDENT_AMBULATORY_CARE_PROVIDER_SITE_OTHER): Payer: Medicare Other | Admitting: Family Medicine

## 2021-02-22 VITALS — Ht 75.0 in | Wt 270.0 lb

## 2021-02-22 DIAGNOSIS — E1165 Type 2 diabetes mellitus with hyperglycemia: Secondary | ICD-10-CM

## 2021-02-22 DIAGNOSIS — F1190 Opioid use, unspecified, uncomplicated: Secondary | ICD-10-CM

## 2021-02-22 DIAGNOSIS — Z794 Long term (current) use of insulin: Secondary | ICD-10-CM

## 2021-02-22 NOTE — Progress Notes (Signed)
Interactive audio and video telecommunications were attempted between this provider and patient, however failed, due to patient having technical difficulties OR patient did not have access to video capability.  We continued and completed visit with audio only.   Virtual Visit via Telephone Note  I connected with patient on 02/22/21  at 12:36 PM  by telephone and verified that I am speaking with the correct person using two identifiers.  Location of patient: home   Location of MD: [facility] Name of referring provider (if blank then none associated): Names per persons and role in encounter:  MD: Ferd Hibbs, Patient: name listed above.    I discussed the limitations, risks, security and privacy concerns of performing an evaluation and management service by telephone and the availability of in person appointments. I also discussed with the patient that there may be a patient responsible charge related to this service. The patient expressed understanding and agreed to proceed.  CC: pain f/u.    History of Present Illness:   Still in pain at baseline-chronic issue for patient.  D/w pt about delay in medication refill.  He was able to get his meds in the meantime, since yesterday with some pain relief.  Still in pain but less than prev.  Prev with supply of pain medication he can get to a manageable level.  He is never pain free.  D/w pt.  He can walk about 1/2 a block max at current level of function. He is going to try to gradually inc his walking tolerance/distance.    He had seen ortho about his knee prev in 10/2020.  Naproxen helped.  His knee is better.    DM2 per ortho with A1c improving.  I'll defer and he agrees.  We can check labs later on if needed, if he doesn't have routine labs done in addition to A1c.    Observations/Objective: Nad Speech normal  Assessment and Plan:  Chronic pain.  Plan on phone visit in January- note sent to scheduling to arrange with patient.  My  intent was not to delay his prescription refill and I worked on it when I became aware that he was in need of refill.  He accepted that explanation and apology.  He can always update me if he has trouble getting refills in the future.  My goal is to avoid him having any such delay.  He is not sedated.  He still in pain but gets some relief and his pain is manageable with his current prescription.  I would continue oxycodone as is with routine cautions and he will update me as needed.  He is going to try to increase his walking distance/tolerance in the meantime and I thanked him for his effort.  Diabetes per endocrinology.  We can get his follow-up labs done here at our clinic if not done through endocrinology.  He will update me as needed.  Follow Up Instructions: see above.    I discussed the assessment and treatment plan with the patient. The patient was provided an opportunity to ask questions and all were answered. The patient agreed with the plan and demonstrated an understanding of the instructions.   The patient was advised to call back or seek an in-person evaluation if the symptoms worsen or if the condition fails to improve as anticipated.  I provided 22 minutes of non-face-to-face time during this encounter.   Crawford Givens, MD

## 2021-02-23 ENCOUNTER — Telehealth: Payer: Self-pay | Admitting: Family Medicine

## 2021-02-23 NOTE — Telephone Encounter (Signed)
-----   Message from Joaquim Nam, MD sent at 02/22/2021 12:59 PM EDT ----- Regarding: please schedule.  Needs phone visit set up in January.  Please call him to schedule.  Thanks.

## 2021-02-23 NOTE — Telephone Encounter (Signed)
Request came in [place]

## 2021-02-23 NOTE — Telephone Encounter (Signed)
Called patient and left vm to call back.

## 2021-02-25 NOTE — Telephone Encounter (Signed)
Noted. Thanks.

## 2021-02-25 NOTE — Assessment & Plan Note (Signed)
Diabetes per endocrinology.  We can get his follow-up labs done here at our clinic if not done through endocrinology.  He will update me as needed.

## 2021-02-25 NOTE — Assessment & Plan Note (Signed)
Chronic pain.  Plan on phone visit in January of 2023- note sent to scheduling to arrange with patient.  My intent was not to delay his prescription refill and I worked on it when I became aware that he was in need of refill.  He accepted that explanation and apology.  He can always update me if he has trouble getting refills in the future.  My goal is to avoid him having any such delay.  He is not sedated.  He still in pain but gets some relief and his pain is manageable with his current prescription.  I would continue oxycodone as is with routine cautions and he will update me as needed.  He is going to try to increase his walking distance/tolerance in the meantime and I thanked him for his effort.

## 2021-04-17 ENCOUNTER — Other Ambulatory Visit: Payer: Self-pay | Admitting: Family Medicine

## 2021-04-17 DIAGNOSIS — Z794 Long term (current) use of insulin: Secondary | ICD-10-CM

## 2021-04-17 DIAGNOSIS — F1190 Opioid use, unspecified, uncomplicated: Secondary | ICD-10-CM

## 2021-04-17 DIAGNOSIS — E1165 Type 2 diabetes mellitus with hyperglycemia: Secondary | ICD-10-CM

## 2021-04-17 DIAGNOSIS — F32A Depression, unspecified: Secondary | ICD-10-CM

## 2021-04-17 DIAGNOSIS — F419 Anxiety disorder, unspecified: Secondary | ICD-10-CM

## 2021-04-17 DIAGNOSIS — M961 Postlaminectomy syndrome, not elsewhere classified: Secondary | ICD-10-CM

## 2021-04-17 DIAGNOSIS — G8929 Other chronic pain: Secondary | ICD-10-CM

## 2021-04-17 NOTE — Telephone Encounter (Signed)
Refill request for ALPRAZolam (XANAX) 0.5 MG tablet  LOV - 02/22/21 Next OV - 06/19/21 Last refill - 01/17/21 #90/2  Also needs refill on Metformin

## 2021-04-18 MED ORDER — OXYCODONE-ACETAMINOPHEN 10-325 MG PO TABS: 1.0000 | ORAL_TABLET | Freq: Three times a day (TID) | ORAL | 0 refills | Status: DC | PRN

## 2021-04-18 NOTE — Telephone Encounter (Signed)
He should have 1 fill left on his pain medicine.  I sent the next 3 months after that in the meantime.   And I sent rx for metformin and xanax.  Please let him know.  Thanks.

## 2021-05-15 ENCOUNTER — Other Ambulatory Visit: Payer: Self-pay | Admitting: Family Medicine

## 2021-05-15 DIAGNOSIS — E1165 Type 2 diabetes mellitus with hyperglycemia: Secondary | ICD-10-CM

## 2021-05-15 DIAGNOSIS — I10 Essential (primary) hypertension: Secondary | ICD-10-CM

## 2021-06-15 ENCOUNTER — Other Ambulatory Visit: Payer: Self-pay | Admitting: Family Medicine

## 2021-06-19 ENCOUNTER — Encounter: Payer: Self-pay | Admitting: Family Medicine

## 2021-06-19 ENCOUNTER — Other Ambulatory Visit: Payer: Self-pay

## 2021-06-19 ENCOUNTER — Telehealth (INDEPENDENT_AMBULATORY_CARE_PROVIDER_SITE_OTHER): Payer: Medicare Other | Admitting: Family Medicine

## 2021-06-19 DIAGNOSIS — M545 Low back pain, unspecified: Secondary | ICD-10-CM | POA: Diagnosis not present

## 2021-06-19 DIAGNOSIS — G8929 Other chronic pain: Secondary | ICD-10-CM

## 2021-06-19 DIAGNOSIS — Z794 Long term (current) use of insulin: Secondary | ICD-10-CM

## 2021-06-19 DIAGNOSIS — E1165 Type 2 diabetes mellitus with hyperglycemia: Secondary | ICD-10-CM
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
## 2021-06-20 NOTE — Assessment & Plan Note (Signed)
°  Diabetes per endocrine.  He will follow-up with endocrine and update me as needed.

## 2021-06-20 NOTE — Assessment & Plan Note (Signed)
Chronic pain.  Pain is manageable with current opiate prescription in addition to naproxen.  Routine opiate and NSAID cautions discussed with patient.  Not sedated.  GI cautions given with NSAIDs.  He has had multiple attempted interventions previously through the pain clinic, etc. without relief and he does get some relief with medication as is.  I would continue as is and recheck periodically.  He is trying to increase his walking distance daily.  He will update me as needed.  Continue oxycodone with routine cautions, continue naproxen use.

## 2021-06-21 ENCOUNTER — Telehealth: Payer: Self-pay | Admitting: Family Medicine

## 2021-06-21 NOTE — Telephone Encounter (Signed)
-----   Message from Tonia Ghent, MD sent at 06/19/2021 11:11 AM EST ----- Regarding: please schedule visit- phone visit- for around May 2023

## 2021-06-21 NOTE — Telephone Encounter (Signed)
Called pat and got him scheduled for 5/8 @10 

## 2021-07-02 ENCOUNTER — Other Ambulatory Visit: Payer: Self-pay | Admitting: Family Medicine

## 2021-07-02 DIAGNOSIS — G4700 Insomnia, unspecified: Secondary | ICD-10-CM

## 2021-07-02 DIAGNOSIS — E1165 Type 2 diabetes mellitus with hyperglycemia: Secondary | ICD-10-CM

## 2021-07-02 DIAGNOSIS — Z794 Long term (current) use of insulin: Secondary | ICD-10-CM

## 2021-08-12 ENCOUNTER — Other Ambulatory Visit: Payer: Self-pay | Admitting: Family Medicine

## 2021-08-12 DIAGNOSIS — G8929 Other chronic pain: Secondary | ICD-10-CM

## 2021-08-12 DIAGNOSIS — F1190 Opioid use, unspecified, uncomplicated: Secondary | ICD-10-CM

## 2021-08-12 DIAGNOSIS — M961 Postlaminectomy syndrome, not elsewhere classified: Secondary | ICD-10-CM

## 2021-08-12 MED ORDER — OXYCODONE-ACETAMINOPHEN 10-325 MG PO TABS: 1.0000 | ORAL_TABLET | Freq: Three times a day (TID) | ORAL | 0 refills | Status: DC | PRN

## 2021-08-12 NOTE — Progress Notes (Addendum)
Rx sent x3.  ?Has f/u scheduled.  ?========= ?Reportedly prescribing failure on 3 oxycodone prescriptions.  I have resent them today.  Please verify receipt with pharmacy.  Thanks. ?

## 2021-08-13 ENCOUNTER — Other Ambulatory Visit: Payer: Self-pay | Admitting: Family Medicine

## 2021-08-13 DIAGNOSIS — M5442 Lumbago with sciatica, left side: Secondary | ICD-10-CM

## 2021-08-13 DIAGNOSIS — F419 Anxiety disorder, unspecified: Secondary | ICD-10-CM

## 2021-08-13 DIAGNOSIS — Z794 Long term (current) use of insulin: Secondary | ICD-10-CM

## 2021-08-13 DIAGNOSIS — E1165 Type 2 diabetes mellitus with hyperglycemia: Secondary | ICD-10-CM

## 2021-08-13 DIAGNOSIS — G8929 Other chronic pain: Secondary | ICD-10-CM

## 2021-08-13 DIAGNOSIS — I10 Essential (primary) hypertension: Secondary | ICD-10-CM

## 2021-08-13 DIAGNOSIS — F1190 Opioid use, unspecified, uncomplicated: Secondary | ICD-10-CM

## 2021-08-13 DIAGNOSIS — M961 Postlaminectomy syndrome, not elsewhere classified: Secondary | ICD-10-CM

## 2021-08-13 DIAGNOSIS — F32A Depression, unspecified: Secondary | ICD-10-CM

## 2021-08-14 MED ORDER — OXYCODONE-ACETAMINOPHEN 10-325 MG PO TABS: 1.0000 | ORAL_TABLET | Freq: Three times a day (TID) | ORAL | 0 refills | Status: DC | PRN

## 2021-08-14 MED ORDER — ALPRAZOLAM 0.5 MG PO TABS: 0.5000 mg | ORAL_TABLET | Freq: Three times a day (TID) | ORAL | 3 refills | Status: DC | PRN

## 2021-08-14 MED ORDER — LISINOPRIL 20 MG PO TABS: 20.0000 mg | ORAL_TABLET | Freq: Every day | ORAL | 1 refills | Status: DC

## 2021-08-14 NOTE — Addendum Note (Signed)
Addended by: Joaquim Nam on: 08/14/2021 04:07 PM ? ? Modules accepted: Orders ? ?

## 2021-08-14 NOTE — Telephone Encounter (Signed)
Please cancel the prev oxycodone rxs.  Resent in the meantime.  Thanks.  ?

## 2021-08-14 NOTE — Telephone Encounter (Signed)
Called and spoke with patient and notified patient about rxs that were sent in for [facility]. Patient got upset as the oxycodone was the only rx that was supposed to go to [facility]. [facility] is still his normal pharmacy and all rxs need to go there; only changed oxy because the oxy is one backorder there. I called [facility] and cancelled out the rxs for oxy. Patient need the lisinopril and xanax sent to [facility].  ?

## 2021-08-14 NOTE — Telephone Encounter (Signed)
Xanax and lisinopril Rx sent.  Thanks.  ?

## 2021-08-14 NOTE — Telephone Encounter (Signed)
Refill request for ALPRAZOLAM 0.5 MG TAB ? ?LOV - 06/19/21 ?Next OV - 10/15/21 ?Last refill - 04/18/21 #90/3 ? ?Duplicate request for the oxycodone and also needs lisinopril refilled. ?

## 2021-08-14 NOTE — Progress Notes (Signed)
New rxs went thru to pharmacy. ?

## 2021-08-14 NOTE — Addendum Note (Signed)
Addended by: Joaquim Nam on: 08/14/2021 07:40 AM ? ? Modules accepted: Orders ? ?

## 2021-08-14 NOTE — Telephone Encounter (Signed)
Pt needs the Oxycodone sent to [facility] in [place]  ?

## 2021-09-14 ENCOUNTER — Other Ambulatory Visit: Payer: Self-pay | Admitting: Family Medicine

## 2021-10-15 ENCOUNTER — Encounter: Payer: Self-pay | Admitting: Family Medicine

## 2021-10-15 ENCOUNTER — Telehealth (INDEPENDENT_AMBULATORY_CARE_PROVIDER_SITE_OTHER): Payer: Medicare Other | Admitting: Family Medicine

## 2021-10-15 DIAGNOSIS — M545 Low back pain, unspecified: Secondary | ICD-10-CM | POA: Diagnosis not present

## 2021-10-15 DIAGNOSIS — F419 Anxiety disorder, unspecified: Secondary | ICD-10-CM

## 2021-10-15 DIAGNOSIS — E1165 Type 2 diabetes mellitus with hyperglycemia: Secondary | ICD-10-CM

## 2021-10-15 DIAGNOSIS — M5442 Lumbago with sciatica, left side: Secondary | ICD-10-CM | POA: Diagnosis not present

## 2021-10-15 DIAGNOSIS — M961 Postlaminectomy syndrome, not elsewhere classified: Secondary | ICD-10-CM

## 2021-10-15 DIAGNOSIS — F1190 Opioid use, unspecified, uncomplicated: Secondary | ICD-10-CM | POA: Diagnosis not present

## 2021-10-15 DIAGNOSIS — F32A Depression, unspecified: Secondary | ICD-10-CM

## 2021-10-15 DIAGNOSIS — Z794 Long term (current) use of insulin: Secondary | ICD-10-CM

## 2021-10-15 DIAGNOSIS — G8929 Other chronic pain: Secondary | ICD-10-CM

## 2021-10-15 MED ORDER — NOVOLIN 70/30 (70-30) 100 UNIT/ML ~~LOC~~ SUSP: 18.0000 [IU] | Freq: Two times a day (BID) | SUBCUTANEOUS | Status: DC

## 2021-10-15 NOTE — Progress Notes (Signed)
Interactive audio and video telecommunications were attempted between this provider and patient, however failed, due to patient having technical difficulties OR patient did not have access to video capability.  We continued and completed visit with audio only.  ? ?Virtual Visit via Telephone Note ? ?I connected with patient on 10/15/21  at 10:22 AM  by telephone and verified that I am speaking with the correct person using two identifiers. ? ?Location of patient: home  ? ?Location of MD: [place] ?Name of referring provider (if blank then none associated): ?Names per persons and role in encounter:  MD: Ferd Hibbs, Patient: name listed above.  ?  ?I discussed the limitations, risks, security and privacy concerns of performing an evaluation and management service by telephone and the availability of in person appointments. I also discussed with the patient that there may be a patient responsible charge related to this service. The patient expressed understanding and agreed to proceed. ? ?CC: follow up.   ? ?History of Present Illness:  ?"I'm doing okay."  A1c was 8.1 on recent endo visit, d/w pt.  His A1c was prev higher, after he had fallen and wasn't able to walk as much . He is working on diet.   ? ?7/10 pain on waking this AM, less pain after taking AM meds, 5/10 now.  This is currently manageable.  He is getting oxycodone filled today.  He'll need new rxs x3 months starting early June.  [id number] [address], [place].  Needs xanax and oxycodone refilled, d/w pt.   ? ?No ADE on meds.  On bowel regimen with coconut water.  That helps.  He is able to manage pain but some days are worse than others with more pain.  On those days he uses a oversided inflatable yoga ball to do his stretches and that help.  He has lost some weight in the meantime "and I'm feeling better."   ? ?He is walking in the AMs, early.  And he is walking later in the afternoon.  He isn't walking alone.    ? ?Observations/Objective: ?Nad ?Speech wnl.   ? ?Assessment and Plan: ?Chronic pain.  Not sedated.  Some relief with current meds but not pain free.  Still okay for outpatient f/u.  Plan on follow up by phone in about 3 months.   Routine cautions d/w pt.  He agrees.  He is trying to be as active as possible.  Mood is still good, outlook brighter today.   ? ?DM per endo as above.   ? ?Follow Up Instructions: see above.   ? ?I discussed the assessment and treatment plan with the patient. The patient was provided an opportunity to ask questions and all were answered. The patient agreed with the plan and demonstrated an understanding of the instructions. ?  ?The patient was advised to call back or seek an in-person evaluation if the symptoms worsen or if the condition fails to improve as anticipated. ? ?I provided 19 minutes of non-face-to-face time during this encounter. ? ?Crawford Givens, MD ?

## 2021-10-17 MED ORDER — ALPRAZOLAM 0.5 MG PO TABS: 0.5000 mg | ORAL_TABLET | Freq: Three times a day (TID) | ORAL | 3 refills | Status: DC | PRN

## 2021-10-17 MED ORDER — OXYCODONE-ACETAMINOPHEN 10-325 MG PO TABS: 1.0000 | ORAL_TABLET | Freq: Three times a day (TID) | ORAL | 0 refills | Status: DC | PRN

## 2021-10-17 NOTE — Assessment & Plan Note (Signed)
? ?  DM per endo as above.   ?

## 2021-10-17 NOTE — Assessment & Plan Note (Signed)
Chronic pain.  Not sedated.  Some relief with current meds but not pain free.  Still okay for outpatient f/u.  Plan on follow up by phone in about 3 months.   Routine cautions d/w pt.  He agrees.  He is trying to be as active as possible.  Mood is still good, outlook brighter today.   ?

## 2021-11-21 ENCOUNTER — Telehealth: Payer: Self-pay | Admitting: Family Medicine

## 2021-11-21 NOTE — Telephone Encounter (Signed)
Left message for patient to call back and schedule Medicare Annual Wellness Visit (AWV) either virtually or phone   Last AWV ;11/20/20   I left my direct # [phone]  

## 2021-12-13 ENCOUNTER — Other Ambulatory Visit: Payer: Self-pay | Admitting: Family Medicine

## 2021-12-17 ENCOUNTER — Telehealth: Payer: Self-pay | Admitting: Family Medicine

## 2021-12-17 NOTE — Telephone Encounter (Signed)
Spoke with patient. He stated that [facility] is out of oxycodone-acetaminophen 10/325 mg tablets. [facility] called the patient and told him they only had 40 tablets in stock and did not know when they would get more in. He okayed them to fill the rx for 40 tablets. Patient called and stated we wants his next refill sent to his previous pharmacy he was using. I advised patient we can do that once he is finishing up with the 40 tablets [facility] gave him. He will call back when he needs the refill and will then let us know where to send it in at with the supply issues.

## 2021-12-17 NOTE — Telephone Encounter (Signed)
Pt called wanting to speak with the nurse about his medication for: oxyCODONE-acetaminophen (PERCOCET) 10-325 MG tablet. He said there was a supply issue but it is "No emergency", to return a call when possible. He has not ran out of this medication yet. Please advise, thank you.   Callback Number: [phone]

## 2021-12-26 ENCOUNTER — Telehealth: Payer: Self-pay | Admitting: Family Medicine

## 2021-12-26 DIAGNOSIS — G8929 Other chronic pain: Secondary | ICD-10-CM

## 2021-12-26 DIAGNOSIS — M961 Postlaminectomy syndrome, not elsewhere classified: Secondary | ICD-10-CM

## 2021-12-26 DIAGNOSIS — F1190 Opioid use, unspecified, uncomplicated: Secondary | ICD-10-CM

## 2021-12-26 MED ORDER — OXYCODONE-ACETAMINOPHEN 10-325 MG PO TABS: 1.0000 | ORAL_TABLET | Freq: Three times a day (TID) | ORAL | 0 refills | Status: DC | PRN

## 2021-12-26 NOTE — Telephone Encounter (Signed)
Patient called and asked to speak with Dr.Duncan CMA regarding medications. Call back number [phone].

## 2021-12-26 NOTE — Telephone Encounter (Signed)
Alex from [facility] called in stating there is an error with the date of one of the refills they received for Loistine Chance. The refill date is showing 9/08/09/21 and she just need clarification. Thank you.

## 2021-12-26 NOTE — Telephone Encounter (Signed)
Called patient back and he is requesting refill on his oxycodone 10-325 mg tabs. Patient called and confirmed that [facility] has rx in stock and would like sent there.   LOV - 10/15/21 NOV - 01/21/22 RF - 10/17/21 #90/0 x 3

## 2021-12-26 NOTE — Telephone Encounter (Signed)
Called pharmacy back and clarified rx should read 02/10/22.

## 2021-12-26 NOTE — Telephone Encounter (Addendum)
Sent x3. Thanks.

## 2021-12-26 NOTE — Addendum Note (Signed)
Addended by: Joaquim Nam on: 12/26/2021 02:27 PM   Modules accepted: Orders

## 2021-12-27 ENCOUNTER — Telehealth: Payer: Self-pay | Admitting: Family Medicine

## 2021-12-27 MED ORDER — OXYCODONE-ACETAMINOPHEN 10-325 MG PO TABS: 1.0000 | ORAL_TABLET | Freq: Three times a day (TID) | ORAL | 0 refills | Status: DC | PRN

## 2021-12-27 NOTE — Telephone Encounter (Signed)
I thought I sent this to you when this occurred but apparently I did not. Patient is short 50 tablets this month as [facility] did not have his full prescription in stock when he filled julys rx. I had called and verified this with them and that was correct and they did not know when they were gonna get more in. Patient will now be out tomorrow and needs the rest of this rx sent to [facility] since they can not fill the other ones that were sent yesterday.

## 2021-12-27 NOTE — Addendum Note (Signed)
Addended by: Joaquim Nam on: 12/27/2021 02:00 PM   Modules accepted: Orders

## 2021-12-27 NOTE — Telephone Encounter (Signed)
Spoke with patient about his rx. Patient needs more of his oxycodone sent in. I have documented this in another TE note and sent to Dr. Para March.

## 2021-12-27 NOTE — Telephone Encounter (Signed)
Sent. Thanks.   

## 2021-12-27 NOTE — Telephone Encounter (Signed)
Pt call in requesting a call back regarding medication concern # please advise #[phone]

## 2021-12-28 ENCOUNTER — Ambulatory Visit (INDEPENDENT_AMBULATORY_CARE_PROVIDER_SITE_OTHER): Payer: Medicare Other

## 2021-12-28 VITALS — Ht 74.0 in | Wt 275.0 lb

## 2021-12-28 DIAGNOSIS — Z0000 Encounter for general adult medical examination without abnormal findings: Secondary | ICD-10-CM

## 2021-12-28 NOTE — Progress Notes (Signed)
I connected with Kevin Huff today by telephone and verified that I am speaking with the correct person using two identifiers. Location patient: home Location provider: work Persons participating in the virtual visit: Kevin Huff, Kevin Durand LPN.   I discussed the limitations, risks, security and privacy concerns of performing an evaluation and management service by telephone and the availability of in person appointments. I also discussed with the patient that there may be a patient responsible charge related to this service. The patient expressed understanding and verbally consented to this telephonic visit.    Interactive audio and video telecommunications were attempted between this provider and patient, however failed, due to patient having technical difficulties OR patient did not have access to video capability.  We continued and completed visit with audio only.     Vital signs may be patient reported or missing.  Subjective:   Kevin Huff is a 56 y.o. male who presents for Medicare Annual/Subsequent preventive examination.  Review of Systems     Cardiac Risk Factors include: advanced age (>92mn, >>103women);diabetes mellitus;hypertension;male gender;obesity (BMI >30kg/m2)     Objective:    Today's Vitals   12/28/21 1156  Weight: 275 lb (124.7 kg)  Height: 6' 2"  (1.88 m)  PainSc: 6    Body mass index is 35.31 kg/m.     12/28/2021   12:05 PM 11/20/2020    2:08 PM 09/09/2019    9:45 AM 09/05/2015    9:04 AM 07/22/2015   12:34 PM  Advanced Directives  Does Patient Have a Medical Advance Directive? Yes Yes Yes Yes No  Type of AParamedicof [place]our[place]Living will [place] Rey OaksLiving will [place] will Living will   Copy of [place] Chart? No - copy requested No - copy requested No - copy requested    Would patient like information on creating a medical advance directive?     No  - patient declined information    Current Medications (verified) Outpatient Encounter Medications as of 12/28/2021  Medication Sig   ALPRAZolam (XANAX) 0.5 MG tablet Take 1 tablet (0.5 mg total) by mouth 3 (three) times daily as needed for anxiety.   Blood Glucose Monitoring Suppl (ONETOUCH VERIO) w/Device KIT 1 Units by Does not apply route 3 (three) times daily.   Fexofenadine HCl (ALLEGRA PO) Take by mouth daily.   glipiZIDE (GLUCOTROL) 10 MG tablet TAKE 1 TABLET BY MOUTH TWICE DAILY BEFORE A MEAL   glucose blood (ONETOUCH VERIO) test strip Use as instructed   insulin NPH-regular Human (NOVOLIN 70/30) (70-30) 100 UNIT/ML injection Inject 18 Units into the skin 2 (two) times daily with a meal.   lisinopril (ZESTRIL) 20 MG tablet Take 1 tablet (20 mg total) by mouth daily.   metFORMIN (GLUCOPHAGE-XR) 500 MG 24 hr tablet TAKE 2 TABLETS BY MOUTH TWICE A DAY   naproxen (NAPROSYN) 500 MG tablet TAKE 1 TABLET BY MOUTH TWICE A DAY AS NEEDED TAKE WITH FOOD.   oxyCODONE-acetaminophen (PERCOCET) 10-325 MG tablet Take 1 tablet by mouth every 8 (eight) hours as needed. for pain   traZODone (DESYREL) 100 MG tablet TAKE 2 TABLETS BY MOUTH AT BEDTIME AS NEEDED FOR SLEEP   oxyCODONE-acetaminophen (PERCOCET) 10-325 MG tablet Take 1 tablet by mouth every 8 (eight) hours as needed for pain.   oxyCODONE-acetaminophen (PERCOCET) 10-325 MG tablet Take 1 tablet by mouth every 8 (eight) hours as needed for pain.   oxyCODONE-acetaminophen (PERCOCET) 10-325 MG tablet Take  1 tablet by mouth every 8 (eight) hours as needed for pain (fill 50 tabs that patient was shorted on prev rx).   pioglitazone (ACTOS) 15 MG tablet Take by mouth.   No facility-administered encounter medications on file as of 12/28/2021.    Allergies (verified) Duloxetine, Codeine, Sertraline hcl, and Penicillins   History: Past Medical History:  Diagnosis Date   Chronic back pain greater than 3 months duration    Depression    Diabetes  mellitus without complication ([place])    Hypertension    Past Surgical History:  Procedure Laterality Date   BACK SURGERY     KNEE SURGERY     Family History  Problem Relation Age of Onset   Cancer Mother    Hyperlipidemia Father    Hypertension Father    Diabetes Father    Heart disease Father    Social History   Socioeconomic History   Marital status: Married    Spouse name: Not on file   Number of children: Not on file   Years of education: Not on file   Highest education level: Not on file  Occupational History   Not on file  Tobacco Use   Smoking status: Never   Smokeless tobacco: Never  Vaping Use   Vaping Use: Never used  Substance and Sexual Activity   Alcohol use: No   Drug use: Never   Sexual activity: Yes    Partners: Female  Other Topics Concern   Not on file  Social History Narrative   From [place] Determinants of Health   Financial Resource Strain: Low Risk  (12/28/2021)   Overall Financial Resource Strain (CARDIA)    Difficulty of Paying Living Expenses: Not very hard  Food Insecurity: No Food Insecurity (12/28/2021)   Hunger Vital Sign    Worried About Running Out of Food in the Last Year: Never true    Ran Out of Food in the Last Year: Never true  Transportation Needs: No Transportation Needs (12/28/2021)   PRAPARE - Hydrologist (Medical): No    Lack of Transportation (Non-Medical): No  Physical Activity: Inactive (12/28/2021)   Exercise Vital Sign    Days of Exercise per Week: 0 days    Minutes of Exercise per Session: 0 min  Stress: No Stress Concern Present (12/28/2021)   [place]    Feeling of Stress : Only a little  Social Connections: Not on file    Tobacco Counseling Counseling given: Not Answered   Clinical Intake:  Pre-visit preparation completed: Yes  Pain : 0-10 Pain Score: 6  Pain Type: Chronic pain Pain  Location: Back Pain Orientation: Lower Pain Descriptors / Indicators: Aching Pain Onset: More than a month ago Pain Frequency: Constant     Nutritional Status: BMI > 30  Obese Nutritional Risks: None Diabetes: Yes  How often do you need to have someone help you when you read instructions, pamphlets, or other written materials from your doctor or pharmacy?: 1 - Never  Diabetic? Yes Nutrition Risk Assessment:  Has the patient had any N/V/D within the last 2 months?  No  Does the patient have any non-healing wounds?  No  Has the patient had any unintentional weight loss or weight gain?  No   Diabetes:  Is the patient diabetic?  Yes  If diabetic, was a CBG obtained today?  No  Did the patient bring in their glucometer  from home?  No  How often do you monitor your CBG's? occasionally.   Financial Strains and Diabetes Management:  Are you having any financial strains with the device, your supplies or your medication? No .  Does the patient want to be seen by Chronic Care Management for management of their diabetes?  No  Would the patient like to be referred to a Nutritionist or for Diabetic Management?  No   Diabetic Exams:  Diabetic Eye Exam: Overdue for diabetic eye exam. Pt has been advised about the importance in completing this exam. Patient advised to call and schedule an eye exam. Diabetic Foot Exam: Overdue, Pt has been advised about the importance in completing this exam. Pt is scheduled for diabetic foot exam on next appointment.   Interpreter Needed?: No  Information entered by :: NAllen LPN   Activities of Daily Living    12/28/2021   12:07 PM  In your present state of health, do you have any difficulty performing the following activities:  Hearing? 0  Vision? 0  Difficulty concentrating or making decisions? 0  Walking or climbing stairs? 1  Dressing or bathing? 1  Doing errands, shopping? 0  Preparing Food and eating ? N  Using the Toilet? N  In the  past six months, have you accidently leaked urine? N  Do you have problems with loss of bowel control? N  Managing your Medications? N  Comment works with spouse  Armed forces operational officer? N  Housekeeping or managing your Housekeeping? N    Patient Care Team: Tonia Ghent, MD as PCP - General (Family Medicine)  Indicate any recent Medical Services you may have received from other than [facility] providers in the past year (date may be approximate).     Assessment:   This is a routine wellness examination for Kevin Huff.  Hearing/Vision screen Vision Screening - Comments:: No regular eye [place]  Dietary issues and exercise activities discussed: Current Exercise Habits: The patient does not participate in regular exercise at present   Goals Addressed             This Visit's Progress    Patient Stated       12/28/2021, keeping blood sugar down       Depression Screen    12/28/2021   12:07 PM 11/20/2020    2:11 PM 10/31/2020    9:50 AM 09/09/2019    9:50 AM 03/01/2019    9:52 AM 11/26/2017    3:19 PM  PHQ 2/9 Scores  PHQ - 2 Score 0 0 0 0 0 2  PHQ- 9 Score  0 0 0 1     Fall Risk    12/28/2021   12:06 PM 11/20/2020    2:09 PM 10/31/2020    9:47 AM 09/09/2019    9:48 AM  Fall Risk   Falls in the past year? 0 1 1 1   Comment    tripped and fell  Number falls in past yr: 0 0 0 1  Injury with Fall? 0 1 1 0  Risk for fall due to : Impaired balance/gait;Impaired mobility;Medication side effect Medication side effect;Impaired balance/gait History of fall(s) History of fall(s);Impaired balance/gait;Impaired mobility;Medication side effect  Follow up Falls evaluation completed;Education provided;Falls prevention discussed Falls evaluation completed;Falls prevention discussed Falls evaluation completed Falls evaluation completed;Falls prevention discussed    FALL RISK PREVENTION PERTAINING TO THE HOME:  Any stairs in or around the home? Yes  If so, are there any  without handrails?  No  Home free of loose throw rugs in walkways, pet beds, electrical cords, etc? Yes  Adequate lighting in your home to reduce risk of falls? Yes   ASSISTIVE DEVICES UTILIZED TO PREVENT FALLS:  Life alert? No  Use of a cane, walker or w/c? Yes  Grab bars in the bathroom? Yes  Shower chair or bench in shower? No  Elevated toilet seat or a handicapped toilet? Yes   TIMED UP AND GO:  Was the test performed? No .       Cognitive Function:    11/20/2020    2:16 PM 09/09/2019    9:53 AM  MMSE - Mini Mental State Exam  Not completed: Refused   Orientation to time  5  Orientation to Place  5  Registration  3  Attention/ Calculation  5  Recall  3  Language- repeat  1        12/28/2021   12:09 PM  6CIT Screen  What Year? 0 points  What month? 0 points  What time? 0 points  Count back from 20 0 points  Months in reverse 0 points  Repeat phrase 6 points  Total Score 6 points    Immunizations Immunization History  Administered Date(s) Administered   Fluad Quad(high Dose 65+) 05/11/2019   Influenza,inj,Quad PF,6+ Mos 02/27/2018, 04/07/2020, 03/30/2021   Pneumococcal Polysaccharide-23 11/08/2017    TDAP status: Due, Education has been provided regarding the importance of this vaccine. Advised may receive this vaccine at local pharmacy or Health Dept. Aware to provide a copy of the vaccination record if obtained from local pharmacy or Health Dept. Verbalized acceptance and understanding.  Flu Vaccine status: Up to date  Pneumococcal vaccine status: Up to date  Covid-19 vaccine status: Declined, Education has been provided regarding the importance of this vaccine but patient still declined. Advised may receive this vaccine at local pharmacy or Health Dept.or vaccine clinic. Aware to provide a copy of the vaccination record if obtained from local pharmacy or Health Dept. Verbalized acceptance and understanding.  Qualifies for Shingles Vaccine? Yes    Zostavax completed No   Shingrix Completed?: No.    Education has been provided regarding the importance of this vaccine. Patient has been advised to call insurance company to determine out of pocket expense if they have not yet received this vaccine. Advised may also receive vaccine at local pharmacy or Health Dept. Verbalized acceptance and understanding.  Screening Tests Health Maintenance  Topic Date Due   COVID-19 Vaccine (1) Never done   Zoster Vaccines- Shingrix (1 of 2) Never done   OPHTHALMOLOGY EXAM  08/25/2020   HEMOGLOBIN A1C  09/15/2020   FOOT EXAM  03/17/2021   TETANUS/TDAP  09/08/2024 (Originally [birth year]1982)   INFLUENZA VACCINE  01/08/2022   Fecal DNA (Cologuard)  09/23/2022   Hepatitis C Screening  Completed   HIV Screening  Completed   HPV VACCINES  Aged Out    Health Maintenance  Health Maintenance Due  Topic Date Due   COVID-19 Vaccine (1) Never done   Zoster Vaccines- Shingrix (1 of 2) Never done   OPHTHALMOLOGY EXAM  08/25/2020   HEMOGLOBIN A1C  09/15/2020   FOOT EXAM  03/17/2021    Colorectal cancer screening: Type of screening: Cologuard. Completed 09/23/2019. Repeat every 3 years  Lung Cancer Screening: (Low Dose CT Chest recommended if Age 5-80 years, 30 pack-year currently smoking OR have quit w/in 15years.) does not qualify.   Lung Cancer Screening Referral: no  Additional Screening:  Hepatitis C Screening: does qualify; Completed 02/27/2018  Vision Screening: Recommended annual ophthalmology exams for early detection of glaucoma and other disorders of the eye. Is the patient up to date with their annual eye exam?  No  Who is the provider or what is the name of the office in which the patient attends annual eye exams? [facility] If pt is not established with a provider, would they like to be referred to a provider to establish care? No .   Dental Screening: Recommended annual dental exams for proper oral hygiene  Community Resource  Referral / Chronic Care Management: CRR required this visit?  No   CCM required this visit?  No      Plan:     I have personally reviewed and noted the following in the patient's chart:   Medical and social history Use of alcohol, tobacco or illicit drugs  Current medications and supplements including opioid prescriptions. Patient is currently taking opioid prescriptions. Information provided to patient regarding non-opioid alternatives. Patient advised to discuss non-opioid treatment plan with their provider. Functional ability and status Nutritional status Physical activity Advanced directives List of other physicians Hospitalizations, surgeries, and ER visits in previous 12 months Vitals Screenings to include cognitive, depression, and falls Referrals and appointments  In addition, I have reviewed and discussed with patient certain preventive protocols, quality metrics, and best practice recommendations. A written personalized care plan for preventive services as well as general preventive health recommendations were provided to patient.     Kellie Simmering, LPN   [id number]   Nurse Notes: none  Due to this being a virtual visit, the after visit summary with patients personalized plan was offered to patient via mail or my-chart.  Patient would like to access on my-chart

## 2021-12-28 NOTE — Patient Instructions (Signed)
Kevin Huff , Thank you for taking time to come for your Medicare Wellness Visit. I appreciate your ongoing commitment to your health goals. Please review the following plan we discussed and let me know if I can assist you in the future.   Screening recommendations/referrals: Colonoscopy: cologuard 09/23/2019, due 09/23/2022 Recommended yearly ophthalmology/optometry visit for glaucoma screening and checkup Recommended yearly dental visit for hygiene and checkup  Vaccinations: Influenza vaccine: due 01/08/2022 Pneumococcal vaccine: n/a Tdap vaccine: due Shingles vaccine: discussed   Covid-19:  decline  Advanced directives: Please bring a copy of your POA (Power of Attorney) and/or Living Will to your next appointment.   Conditions/risks identified: none  Next appointment: Follow up in one year for your annual wellness visit   Preventive Care 40-64 Years, Male Preventive care refers to lifestyle choices and visits with your health care provider that can promote health and wellness. What does preventive care include? A yearly physical exam. This is also called an annual well check. Dental exams once or twice a year. Routine eye exams. Ask your health care provider how often you should have your eyes checked. Personal lifestyle choices, including: Daily care of your teeth and gums. Regular physical activity. Eating a healthy diet. Avoiding tobacco and drug use. Limiting alcohol use. Practicing safe sex. Taking low-dose aspirin every day starting at age 29. What happens during an annual well check? The services and screenings done by your health care provider during your annual well check will depend on your age, overall health, lifestyle risk factors, and family history of disease. Counseling  Your health care provider may ask you questions about your: Alcohol use. Tobacco use. Drug use. Emotional well-being. Home and relationship well-being. Sexual activity. Eating habits. Work  and work Astronomer. Screening  You may have the following tests or measurements: Height, weight, and BMI. Blood pressure. Lipid and cholesterol levels. These may be checked every 5 years, or more frequently if you are over 87 years old. Skin check. Lung cancer screening. You may have this screening every year starting at age 8 if you have a 30-pack-year history of smoking and currently smoke or have quit within the past 15 years. Fecal occult blood test (FOBT) of the stool. You may have this test every year starting at age 13. Flexible sigmoidoscopy or colonoscopy. You may have a sigmoidoscopy every 5 years or a colonoscopy every 10 years starting at age 79. Prostate cancer screening. Recommendations will vary depending on your family history and other risks. Hepatitis C blood test. Hepatitis B blood test. Sexually transmitted disease (STD) testing. Diabetes screening. This is done by checking your blood sugar (glucose) after you have not eaten for a while (fasting). You may have this done every 1-3 years. Discuss your test results, treatment options, and if necessary, the need for more tests with your health care provider. Vaccines  Your health care provider may recommend certain vaccines, such as: Influenza vaccine. This is recommended every year. Tetanus, diphtheria, and acellular pertussis (Tdap, Td) vaccine. You may need a Td booster every 10 years. Zoster vaccine. You may need this after age 85. Pneumococcal 13-valent conjugate (PCV13) vaccine. You may need this if you have certain conditions and have not been vaccinated. Pneumococcal polysaccharide (PPSV23) vaccine. You may need one or two doses if you smoke cigarettes or if you have certain conditions. Talk to your health care provider about which screenings and vaccines you need and how often you need them. This information is not intended to replace advice  given to you by your health care provider. Make sure you discuss any  questions you have with your health care provider. Document Released: 06/23/2015 Document Revised: 02/14/2016 Document Reviewed: 03/28/2015 Elsevier Interactive Patient Education  2017 [place] Prevention in the Home Falls can cause injuries. They can happen to people of all ages. There are many things you can do to make your home safe and to help prevent falls. What can I do on the outside of my home? Regularly fix the edges of walkways and driveways and fix any cracks. Remove anything that might make you trip as you walk through a door, such as a raised step or threshold. Trim any bushes or trees on the path to your home. Use bright outdoor lighting. Clear any walking paths of anything that might make someone trip, such as rocks or tools. Regularly check to see if handrails are loose or broken. Make sure that both sides of any steps have handrails. Any raised decks and porches should have guardrails on the edges. Have any leaves, snow, or ice cleared regularly. Use sand or salt on walking paths during winter. Clean up any spills in your garage right away. This includes oil or grease spills. What can I do in the bathroom? Use night lights. Install grab bars by the toilet and in the tub and shower. Do not use towel bars as grab bars. Use non-skid mats or decals in the tub or shower. If you need to sit down in the shower, use a plastic, non-slip stool. Keep the floor dry. Clean up any water that spills on the floor as soon as it happens. Remove soap buildup in the tub or shower regularly. Attach bath mats securely with double-sided non-slip rug tape. Do not have throw rugs and other things on the floor that can make you trip. What can I do in the bedroom? Use night lights. Make sure that you have a light by your bed that is easy to reach. Do not use any sheets or blankets that are too big for your bed. They should not hang down onto the floor. Have a firm chair that has side  arms. You can use this for support while you get dressed. Do not have throw rugs and other things on the floor that can make you trip. What can I do in the kitchen? Clean up any spills right away. Avoid walking on wet floors. Keep items that you use a lot in easy-to-reach places. If you need to reach something above you, use a strong step stool that has a grab bar. Keep electrical cords out of the way. Do not use floor polish or wax that makes floors slippery. If you must use wax, use non-skid floor wax. Do not have throw rugs and other things on the floor that can make you trip. What can I do with my stairs? Do not leave any items on the stairs. Make sure that there are handrails on both sides of the stairs and use them. Fix handrails that are broken or loose. Make sure that handrails are as long as the stairways. Check any carpeting to make sure that it is firmly attached to the stairs. Fix any carpet that is loose or worn. Avoid having throw rugs at the top or bottom of the stairs. If you do have throw rugs, attach them to the floor with carpet tape. Make sure that you have a light switch at the top of the stairs and the bottom of  the stairs. If you do not have them, ask someone to add them for you. What else can I do to help prevent falls? Wear shoes that: Do not have high heels. Have rubber bottoms. Are comfortable and fit you well. Are closed at the toe. Do not wear sandals. If you use a stepladder: Make sure that it is fully opened. Do not climb a closed stepladder. Make sure that both sides of the stepladder are locked into place. Ask someone to hold it for you, if possible. Clearly mark and make sure that you can see: Any grab bars or handrails. First and last steps. Where the edge of each step is. Use tools that help you move around (mobility aids) if they are needed. These include: Canes. Walkers. Scooters. Crutches. Turn on the lights when you go into a dark area.  Replace any light bulbs as soon as they burn out. Set up your furniture so you have a clear path. Avoid moving your furniture around. If any of your floors are uneven, fix them. If there are any pets around you, be aware of where they are. Review your medicines with your doctor. Some medicines can make you feel dizzy. This can increase your chance of falling. Ask your doctor what other things that you can do to help prevent falls. This information is not intended to replace advice given to you by your health care provider. Make sure you discuss any questions you have with your health care provider. Document Released: 03/23/2009 Document Revised: 11/02/2015 Document Reviewed: 07/01/2014 Elsevier Interactive Patient Education  2017 [employer].

## 2022-01-01 ENCOUNTER — Other Ambulatory Visit: Payer: Self-pay | Admitting: Family Medicine

## 2022-01-01 DIAGNOSIS — Z794 Long term (current) use of insulin: Secondary | ICD-10-CM

## 2022-01-09 ENCOUNTER — Other Ambulatory Visit: Payer: Self-pay | Admitting: Family Medicine

## 2022-01-09 DIAGNOSIS — G4700 Insomnia, unspecified: Secondary | ICD-10-CM

## 2022-01-21 ENCOUNTER — Telehealth (INDEPENDENT_AMBULATORY_CARE_PROVIDER_SITE_OTHER): Payer: Medicare Other | Admitting: Family Medicine

## 2022-01-21 DIAGNOSIS — E1165 Type 2 diabetes mellitus with hyperglycemia: Secondary | ICD-10-CM | POA: Diagnosis not present

## 2022-01-21 DIAGNOSIS — M961 Postlaminectomy syndrome, not elsewhere classified: Secondary | ICD-10-CM | POA: Diagnosis not present

## 2022-01-21 DIAGNOSIS — G8929 Other chronic pain: Secondary | ICD-10-CM

## 2022-01-21 DIAGNOSIS — Z794 Long term (current) use of insulin: Secondary | ICD-10-CM

## 2022-01-21 DIAGNOSIS — F1190 Opioid use, unspecified, uncomplicated: Secondary | ICD-10-CM | POA: Diagnosis not present

## 2022-01-21 DIAGNOSIS — M5442 Lumbago with sciatica, left side: Secondary | ICD-10-CM | POA: Diagnosis not present

## 2022-01-21 MED ORDER — FLUTICASONE PROPIONATE 50 MCG/ACT NA SUSP: 2.0000 | Freq: Every day | NASAL | 3 refills | Status: AC

## 2022-01-21 NOTE — Progress Notes (Unsigned)
Interactive audio and video telecommunications were attempted between this provider and patient, however failed, due to patient having technical difficulties OR patient did not have access to video capability.  We continued and completed visit with audio only.   Virtual Visit via Telephone Note  I connected with patient on 01/21/22  at 11:15 AM  by telephone and verified that I am speaking with the correct person using two identifiers.  Location of patient: home   Location of MD: [facility] Name of referring provider (if blank then none associated): Names per persons and role in encounter:  MD: Ferd Hibbs, Patient: name listed above.    I discussed the limitations, risks, security and privacy concerns of performing an evaluation and management service by telephone and the availability of in person appointments. I also discussed with the patient that there may be a patient responsible charge related to this service. The patient expressed understanding and agreed to proceed.  CC: follow up.    History of Present Illness:   Sinus pressure and congestion. Flonase helped.  Routine cautions d/w pt.  Still on allegra.  No chest congestion.  D/w pt about using nasal saline.    He is working on mobility.  Chronic pain at baseline. No ADE on med.  No falls.  Not sedated.  He'll need rx done for Sept Oct Nov.  Apothecary.  Needs 3 month f/u set.  He is able to walk 0.5 mile a day total, split into multiple shorter distances. He isn't pain free but it is manageable as is with 6/10 pain, with med use.  Some days are better than others.  Not sedated. On bowel regimen and doing well with that.  He was able to cut naproxen back to 1 tab a day, down from BID.    DM per endocrine.  Compliant. A1c improved from 10.4 down to 8.1.  sugar 120-130s recently.  He is going to see about getting labs rechecked via endo.  D/w pt.     Observations/Objective:***    Assessment and Plan:***    Follow Up  Instructions: see above.      I discussed the assessment and treatment plan with the patient. The patient was provided an opportunity to ask questions and all were answered. The patient agreed with the plan and demonstrated an understanding of the instructions.   The patient was advised to call back or seek an in-person evaluation if the symptoms worsen or if the condition fails to improve as anticipated.  I provided *** minutes of non-face-to-face time during this encounter.   Crawford Givens, MD

## 2022-01-23 ENCOUNTER — Telehealth: Payer: Self-pay | Admitting: Family Medicine

## 2022-01-23 MED ORDER — OXYCODONE-ACETAMINOPHEN 10-325 MG PO TABS: 1.0000 | ORAL_TABLET | Freq: Three times a day (TID) | ORAL | 0 refills | Status: DC | PRN

## 2022-01-23 NOTE — Assessment & Plan Note (Signed)
Chronic pain.  No adverse effect on medication.  He is still walking and trying to increase his distance gradually.  His pain is manageable.  He is on a bowel regimen and he was able to cut back on naproxen, down to 1 pill a day.  We talked about using twice a day if needed with routine NSAID cautions.  Opiate cautions discussed with patient. He'll need rx done for Sept Oct Nov.  Apothecary.  Needs 3 month f/u set.  I told him I would work on that.  Rx sent x3 after office visit.

## 2022-01-23 NOTE — Telephone Encounter (Signed)
Please let patient know that next set of prescriptions x3 were sent.  Please see about getting him scheduled for a video/phone visit in about 3 months.  Thanks.

## 2022-01-23 NOTE — Assessment & Plan Note (Signed)
Diabetes per endocrine.  See above.  Improved.

## 2022-01-24 NOTE — Telephone Encounter (Addendum)
Lvm to call and schedule F/U

## 2022-02-08 ENCOUNTER — Other Ambulatory Visit: Payer: Self-pay | Admitting: Family Medicine

## 2022-02-08 DIAGNOSIS — E1165 Type 2 diabetes mellitus with hyperglycemia: Secondary | ICD-10-CM

## 2022-02-08 DIAGNOSIS — I10 Essential (primary) hypertension: Secondary | ICD-10-CM

## 2022-04-23 ENCOUNTER — Encounter: Payer: Self-pay | Admitting: Family Medicine

## 2022-04-23 ENCOUNTER — Telehealth (INDEPENDENT_AMBULATORY_CARE_PROVIDER_SITE_OTHER): Payer: Medicare Other | Admitting: Family Medicine

## 2022-04-23 VITALS — HR 92 | Ht 74.0 in | Wt 276.0 lb

## 2022-04-23 DIAGNOSIS — I10 Essential (primary) hypertension: Secondary | ICD-10-CM

## 2022-04-23 DIAGNOSIS — M961 Postlaminectomy syndrome, not elsewhere classified: Secondary | ICD-10-CM

## 2022-04-23 DIAGNOSIS — E538 Deficiency of other specified B group vitamins: Secondary | ICD-10-CM | POA: Insufficient documentation

## 2022-04-23 DIAGNOSIS — M5442 Lumbago with sciatica, left side: Secondary | ICD-10-CM

## 2022-04-23 DIAGNOSIS — E1165 Type 2 diabetes mellitus with hyperglycemia: Secondary | ICD-10-CM | POA: Diagnosis not present

## 2022-04-23 DIAGNOSIS — F419 Anxiety disorder, unspecified: Secondary | ICD-10-CM

## 2022-04-23 DIAGNOSIS — G8929 Other chronic pain: Secondary | ICD-10-CM

## 2022-04-23 DIAGNOSIS — Z794 Long term (current) use of insulin: Secondary | ICD-10-CM

## 2022-04-23 DIAGNOSIS — F32A Depression, unspecified: Secondary | ICD-10-CM

## 2022-04-23 DIAGNOSIS — F1190 Opioid use, unspecified, uncomplicated: Secondary | ICD-10-CM

## 2022-04-23 MED ORDER — ALPRAZOLAM 0.5 MG PO TABS: 0.5000 mg | ORAL_TABLET | Freq: Three times a day (TID) | ORAL | 3 refills | Status: DC | PRN

## 2022-04-23 MED ORDER — LISINOPRIL 20 MG PO TABS: ORAL_TABLET | ORAL | 3 refills | Status: AC

## 2022-04-23 NOTE — Progress Notes (Unsigned)
Interactive audio and video telecommunications were attempted between this provider and patient, however failed, due to patient having technical difficulties OR patient did not have access to video capability.  We continued and completed visit with audio only.   Virtual Visit via Telephone Note  I connected with patient on 04/23/22  at 11:43 AM  by telephone and verified that I am speaking with the correct person using two identifiers.  Location of patient: home   Location of MD: [place] [facility] Name of referring provider (if blank then none associated): Names per persons and role in encounter:  MD: Ferd Hibbs, Patient: name listed above.    I discussed the limitations, risks, security and privacy concerns of performing an evaluation and management service by telephone and the availability of in person appointments. I also discussed with the patient that there may be a patient responsible charge related to this service. The patient expressed understanding and agreed to proceed.  CC: follow up.    History of Present Illness:   He had DM2 f/u.  A1c was up slightly but lower than his prev levels.  B12 was slightly low.  D/w pt and he is on B12 replacement.  He felt better on replacement- some inc in energy.    HTN.  BP was elevated at endo visit but had sig pain at the time.  He checked his BP in he meantime, usually ~130s/80s at home.  That is typical for patient.    Mood d/w pt.  Still taking xanax prn at baseline and that helps with anxiety.  Not sedated.  No ADE on med.    Chronic pain.  He is working on mobility.  he can walk about 1/8 of a mile twice a day.  He is still stretching and doing yoga to help with balance and walking.  Chronic pain at baseline. No ADE on med.  No falls.  Not sedated. He isn't pain free but it is manageable as is with 6/10 pain, with med use.  Some days are better than others.  Not sedated. On bowel regimen and doing well with that.  He has still been able  to continue naproxen at 1 tab a day.  Due for refill x3 starting on 05/10/22, 06/08/22, and then 07/08/22.  Sent.    He is going to get a flu shot in the near future.     Observations/Objective: Nad Speech normal.  Assessment and Plan: Chronic pain.  Not sedated.  With some relief from his pain medication.  Bowel regimen discussed with patient.  He is trying to walk as much as tolerated.  Continue as is with oxycodone and recheck periodically.  He agrees.  Diabetes. Per endo, has f/u pending.  See above.  Continue work on diet and exercise.    Mood.  Improved with prn alprazolam.  No ADE on med.  Not sedated.  Continue as is.  Hypertension.  Improved on home check, 1 pain is controlled.  Continue lisinopril.  Follow Up Instructions: see above.     I discussed the assessment and treatment plan with the patient. The patient was provided an opportunity to ask questions and all were answered. The patient agreed with the plan and demonstrated an understanding of the instructions.   The patient was advised to call back or seek an in-person evaluation if the symptoms worsen or if the condition fails to improve as anticipated.  I provided 19 minutes of non-face-to-face time during this encounter.  Crawford Givens,  MD

## 2022-04-24 ENCOUNTER — Telehealth: Payer: Self-pay | Admitting: Family Medicine

## 2022-04-24 MED ORDER — OXYCODONE-ACETAMINOPHEN 10-325 MG PO TABS: 1.0000 | ORAL_TABLET | Freq: Three times a day (TID) | ORAL | 0 refills | Status: DC | PRN

## 2022-04-24 NOTE — Assessment & Plan Note (Signed)
Chronic pain.  Not sedated.  With some relief from his pain medication.  Bowel regimen discussed with patient.  He is trying to walk as much as tolerated.  Continue as is with oxycodone and recheck periodically.  He agrees.

## 2022-04-24 NOTE — Assessment & Plan Note (Signed)
Mood.  Improved with prn alprazolam.  No ADE on med.  Not sedated.  Continue as is.

## 2022-04-24 NOTE — Assessment & Plan Note (Signed)
Per endo, has f/u pending.  See above.  Continue work on diet and exercise.

## 2022-04-24 NOTE — Telephone Encounter (Signed)
Please call pt about getting patient set up for 3 months for now.  Via phone visit.  Thanks.

## 2022-04-24 NOTE — Assessment & Plan Note (Signed)
  Hypertension.  Improved on home check, 1 pain is controlled.  Continue lisinopril.

## 2022-05-06 ENCOUNTER — Other Ambulatory Visit: Payer: Self-pay | Admitting: Family Medicine

## 2022-05-06 DIAGNOSIS — I10 Essential (primary) hypertension: Secondary | ICD-10-CM

## 2022-05-06 DIAGNOSIS — E1165 Type 2 diabetes mellitus with hyperglycemia: Secondary | ICD-10-CM

## 2022-05-10 ENCOUNTER — Other Ambulatory Visit: Payer: Self-pay | Admitting: Family Medicine

## 2022-05-10 DIAGNOSIS — E1165 Type 2 diabetes mellitus with hyperglycemia: Secondary | ICD-10-CM

## 2022-05-23 ENCOUNTER — Other Ambulatory Visit: Payer: Self-pay | Admitting: Family Medicine

## 2022-07-01 ENCOUNTER — Other Ambulatory Visit: Payer: Self-pay | Admitting: Family Medicine

## 2022-07-01 DIAGNOSIS — E1165 Type 2 diabetes mellitus with hyperglycemia: Secondary | ICD-10-CM

## 2022-07-09 ENCOUNTER — Other Ambulatory Visit: Payer: Self-pay | Admitting: Family Medicine

## 2022-07-09 DIAGNOSIS — G4700 Insomnia, unspecified: Secondary | ICD-10-CM

## 2022-07-25 ENCOUNTER — Telehealth: Payer: Self-pay | Admitting: Family Medicine

## 2022-07-25 ENCOUNTER — Encounter: Payer: Self-pay | Admitting: Family Medicine

## 2022-07-25 ENCOUNTER — Telehealth (INDEPENDENT_AMBULATORY_CARE_PROVIDER_SITE_OTHER): Payer: Medicare Other | Admitting: Family Medicine

## 2022-07-25 VITALS — HR 88 | Ht 74.0 in | Wt 274.0 lb

## 2022-07-25 DIAGNOSIS — F1190 Opioid use, unspecified, uncomplicated: Secondary | ICD-10-CM

## 2022-07-25 DIAGNOSIS — E1165 Type 2 diabetes mellitus with hyperglycemia: Secondary | ICD-10-CM

## 2022-07-25 DIAGNOSIS — M961 Postlaminectomy syndrome, not elsewhere classified: Secondary | ICD-10-CM | POA: Diagnosis not present

## 2022-07-25 DIAGNOSIS — G8929 Other chronic pain: Secondary | ICD-10-CM

## 2022-07-25 DIAGNOSIS — Z794 Long term (current) use of insulin: Secondary | ICD-10-CM

## 2022-07-25 DIAGNOSIS — M5442 Lumbago with sciatica, left side: Secondary | ICD-10-CM

## 2022-07-25 DIAGNOSIS — I10 Essential (primary) hypertension: Secondary | ICD-10-CM

## 2022-07-25 MED ORDER — OXYCODONE-ACETAMINOPHEN 10-325 MG PO TABS: 1.0000 | ORAL_TABLET | Freq: Three times a day (TID) | ORAL | 0 refills | Status: DC | PRN

## 2022-07-25 NOTE — Telephone Encounter (Signed)
-----   Message from Filbert Berthold, [place] sent at 07/25/2022  9:42 AM EST ----- Regarding: FW: please get patient set up for phone visit in 3 months.  thanks.  ----- Message ----- From: Tonia Ghent, MD Sent: 07/25/2022   9:15 AM EST To: Horald Chestnut Subject: please get patient set up for phone visit in#

## 2022-07-25 NOTE — Telephone Encounter (Signed)
LVM for patient to callback and schedule.  

## 2022-07-25 NOTE — Progress Notes (Signed)
Interactive audio and video telecommunications were attempted between this provider and patient, however failed, due to patient having technical difficulties OR patient did not have access to video capability.  We continued and completed visit with audio only.   Virtual Visit via Telephone Note  I connected with patient on 07/25/22  at 8:52 AM  by telephone and verified that I am speaking with the correct person using two identifiers.  Location of patient: home   Location of MD: [place] Name of referring provider (if blank then none associated): Names per persons and role in encounter:  MD: Earlyne Iba, Patient: name listed above.    I discussed the limitations, risks, security and privacy concerns of performing an evaluation and management service by telephone and the availability of in person appointments. I also discussed with the patient that there may be a patient responsible charge related to this service. The patient expressed understanding and agreed to proceed.  CC: follow up.   History of Present Illness:   DM2 per endo.  D/w pt.  I'll defer.  He has f/u pending.    HTN.  BP has usually been ~130s/80s at home.  prev BP elevation was likely pain related.     Mood d/w pt.  Still taking xanax prn at baseline and that helps with anxiety.  Not sedated.  No ADE on med.  He is on B12 replacement and his energy level is better.  Prev B12 level dw pt.  He'll have recheck at endo clinic.     Chronic pain.  He is working on mobility.  He is walking about 1/8 of a mile twice a day.  He is still stretching and doing yoga to help with balance and walking.  Chronic pain at baseline. No ADE on med.  No falls.  Not sedated. Some days are better than others- worse with rain/weather changes.  He can manage with pain at 5-6/10 at baseline.  Not sedated. On bowel regimen and doing well with that.  He has still been able to continue naproxen at 1 tab a  day.  Observations/Objective: Nad Speech wnl  Assessment and Plan:  DM2 per endo.  D/w pt.  I'll defer.  He has f/u pending.     HTN.  BP has usually been ~130s/80s at home.  prev BP elevation was likely pain related.  Would continue lisinopril as is.  See chronic pain discussion.  Chronic pain.  Not sedated.  He is trying to walk is much as he can tolerate.  He is on a bowel regimen and tolerating that.  He has been able to continue naproxen in the meantime.  Routine cautions given to patient.  Due for Percocet refill x3 starting on 08/07/22, 09/05/22, and then 10/04/22.  Sent.    Follow Up Instructions: see above.    I discussed the assessment and treatment plan with the patient. The patient was provided an opportunity to ask questions and all were answered. The patient agreed with the plan and demonstrated an understanding of the instructions.   The patient was advised to call back or seek an in-person evaluation if the symptoms worsen or if the condition fails to improve as anticipated.  I provided 15 minutes of non-face-to-face time during this encounter.  Elsie Stain, MD

## 2022-07-26 NOTE — Telephone Encounter (Signed)
MyChart Appointment has been scheduled on 10/24/22 at 8:30 with Dr. Damita Dunnings.

## 2022-07-29 NOTE — Assessment & Plan Note (Signed)
DM2 per endo.  D/w pt.  I'll defer.  He has f/u pending.

## 2022-07-29 NOTE — Assessment & Plan Note (Signed)
  HTN.  BP has usually been ~130s/80s at home.  prev BP elevation was likely pain related.  Would continue lisinopril as is.  See chronic pain discussion.

## 2022-07-29 NOTE — Assessment & Plan Note (Signed)
Chronic pain.  Not sedated.  He is trying to walk is much as he can tolerate.  He is on a bowel regimen and tolerating that.  He has been able to continue naproxen in the meantime.  Routine cautions given to patient.  Due for Percocet refill x3 starting on 08/07/22, 09/05/22, and then 10/04/22.  Sent.

## 2022-08-09 ENCOUNTER — Telehealth: Payer: Self-pay | Admitting: Family Medicine

## 2022-08-09 DIAGNOSIS — E1165 Type 2 diabetes mellitus with hyperglycemia: Secondary | ICD-10-CM

## 2022-08-09 MED ORDER — METFORMIN HCL ER 500 MG PO TB24: 1000.0000 mg | ORAL_TABLET | Freq: Two times a day (BID) | ORAL | 2 refills | Status: DC

## 2022-08-09 NOTE — Addendum Note (Signed)
Addended by: Sherrilee Gilles B on: 08/09/2022 02:15 PM   Modules accepted: Orders

## 2022-08-09 NOTE — Telephone Encounter (Signed)
Prescription Request  08/09/2022   LOV: Visit date not found  What is the name of the medication or equipment?  metFORMIN (GLUCOPHAGE-XR) 500 MG 24 hr tablet     Have you contacted your pharmacy to request a refill? Yes pt stated he was told by pharmacy states they've sent multiple refill request over   Which pharmacy would you like this sent to?    [facility] [place] [address] [place] [address] Phone: [phone] Fax: [phone]    Patient notified that their request is being sent to the clinical staff for review and that they should receive a response within 2 business days.   Please advise at Mobile [phone] (mobile)

## 2022-08-09 NOTE — Telephone Encounter (Signed)
Erx sent

## 2022-08-23 ENCOUNTER — Other Ambulatory Visit: Payer: Self-pay

## 2022-08-23 DIAGNOSIS — F419 Anxiety disorder, unspecified: Secondary | ICD-10-CM

## 2022-08-23 NOTE — Telephone Encounter (Signed)
Refill request for Alprazolam 0.5 mg tablets  LOV - 07/25/22 Next OV - 10/24/22 Last refill - 04/23/22 #90/3

## 2022-08-25 MED ORDER — ALPRAZOLAM 0.5 MG PO TABS: 0.5000 mg | ORAL_TABLET | Freq: Three times a day (TID) | ORAL | 3 refills | Status: DC | PRN

## 2022-09-12 LAB — HEMOGLOBIN A1C: Hemoglobin A1C: 8.7

## 2022-09-12 LAB — HM DIABETES EYE EXAM

## 2022-10-11 ENCOUNTER — Other Ambulatory Visit: Payer: Self-pay | Admitting: Family Medicine

## 2022-10-11 DIAGNOSIS — G4700 Insomnia, unspecified: Secondary | ICD-10-CM

## 2022-10-24 ENCOUNTER — Telehealth (INDEPENDENT_AMBULATORY_CARE_PROVIDER_SITE_OTHER): Payer: Medicare Other | Admitting: Family Medicine

## 2022-10-24 ENCOUNTER — Encounter: Payer: Self-pay | Admitting: Family Medicine

## 2022-10-24 ENCOUNTER — Telehealth: Payer: Self-pay

## 2022-10-24 VITALS — HR 92 | Ht 74.0 in | Wt 267.0 lb

## 2022-10-24 DIAGNOSIS — E1165 Type 2 diabetes mellitus with hyperglycemia: Secondary | ICD-10-CM | POA: Diagnosis not present

## 2022-10-24 DIAGNOSIS — Z794 Long term (current) use of insulin: Secondary | ICD-10-CM | POA: Diagnosis not present

## 2022-10-24 DIAGNOSIS — Z1211 Encounter for screening for malignant neoplasm of colon: Secondary | ICD-10-CM | POA: Diagnosis not present

## 2022-10-24 DIAGNOSIS — M961 Postlaminectomy syndrome, not elsewhere classified: Secondary | ICD-10-CM

## 2022-10-24 DIAGNOSIS — M5442 Lumbago with sciatica, left side: Secondary | ICD-10-CM

## 2022-10-24 DIAGNOSIS — F1190 Opioid use, unspecified, uncomplicated: Secondary | ICD-10-CM

## 2022-10-24 DIAGNOSIS — G8929 Other chronic pain: Secondary | ICD-10-CM

## 2022-10-24 MED ORDER — OXYCODONE-ACETAMINOPHEN 10-325 MG PO TABS: 1.0000 | ORAL_TABLET | Freq: Three times a day (TID) | ORAL | 0 refills | Status: DC | PRN

## 2022-10-24 MED ORDER — NOVOLIN 70/30 (70-30) 100 UNIT/ML ~~LOC~~ SUSP: 16.0000 [IU] | Freq: Two times a day (BID) | SUBCUTANEOUS | Status: AC

## 2022-10-24 MED ORDER — METFORMIN HCL ER 500 MG PO TB24: 500.0000 mg | ORAL_TABLET | Freq: Two times a day (BID) | ORAL | Status: DC

## 2022-10-24 NOTE — Telephone Encounter (Signed)
-----   Message from Joaquim Nam, MD sent at 10/24/2022  9:07 AM EDT ----- Please schedule phone visit for about 3 months from today.  Thanks.

## 2022-10-24 NOTE — Progress Notes (Signed)
Interactive audio and video telecommunications were attempted between this provider and patient, however failed, due to patient having technical difficulties OR patient did not have access to video capability.  We continued and completed visit with audio only.   Virtual Visit via Telephone Note  I connected with patient on 10/24/22  at 8:45 AM  by telephone and verified that I am speaking with the correct person using two identifiers.  Location of patient: home   Location of MD: [facility] Kevin of referring provider (if blank then none associated): Names per persons and role in encounter:  MD: Ferd Hibbs, Patient: Kevin Huff.    I discussed the limitations, risks, security and privacy concerns of performing an evaluation and management service by telephone and the availability of in person appointments. I also discussed with the patient that there may be a patient responsible charge related to this service. The patient expressed understanding and agreed to proceed.  CC: chronic pain follow up.    History of Present Illness:   He had DM2 f/u, A1c >8.  He cut back on metformin due to GI sx.  That helped with GI sx.  Max fasting sugars now 140s, that is better.  He had endo f/u pending for later this year.  He had retinal scan.  No diabetic retinopathy or other abnormality noted. No referral needed. Recommend routine annual screening again in 1 year.     D/w patient [id number] for colon cancer screening, including IFOB vs. colonoscopy.  Risks and benefits of both were discussed and patient voiced understanding.  Pt elects for cologuard.  Order sent.   Pain follow up.  More pain with weather changes, esp with recent rain storms/changes in barometric pressure.  He is still working on diet, has intentional weight loss.  Not sedated.  Never pain free but trying to keep pain manageable with current rx.  5/10 is his goal pain level, "I can live with that."  More pain this AM with  effect of meds pending- took recent dose.  Not constipated.     Observations/Objective: Nad Speech normal. Not sedated.  Assessment and Plan:  Diabetes per endocrine.  Discussed with patient.  See Huff.  Colon cancer screening discussed.  Cologuard ordered.  Chronic pain.  He is not pain-free.  He is able to get his pain to a manageable point with Aleve and Percocet.  Not sedated.  Continue as is.  Recheck periodically.  He agrees to plan.  He is trying to be as active as possible.  Follow Up Instructions: see Huff.     I discussed the assessment and treatment plan with the patient. The patient was provided an opportunity to ask questions and all were answered. The patient agreed with the plan and demonstrated an understanding of the instructions.   The patient was advised to call back or seek an in-person evaluation if the symptoms worsen or if the condition fails to improve as anticipated.  I provided 22 minutes of non-face-to-face time during this encounter.  Crawford Givens, MD

## 2022-10-24 NOTE — Telephone Encounter (Signed)
Patient scheduled.

## 2022-10-27 DIAGNOSIS — Z1211 Encounter for screening for malignant neoplasm of colon: Secondary | ICD-10-CM | POA: Insufficient documentation

## 2022-10-27 NOTE — Assessment & Plan Note (Signed)
Colon cancer screening discussed.  Cologuard ordered.

## 2022-10-27 NOTE — Assessment & Plan Note (Signed)
Chronic pain.  He is not pain-free.  He is able to get his pain to a manageable point with Aleve and Percocet.  Not sedated.  Continue as is.  Recheck periodically.  He agrees to plan.  He is trying to be as active as possible.

## 2022-10-27 NOTE — Assessment & Plan Note (Signed)
Diabetes per endocrine.  Discussed with patient.  See above.

## 2022-11-13 ENCOUNTER — Other Ambulatory Visit: Payer: Self-pay | Admitting: Family Medicine

## 2022-11-13 DIAGNOSIS — E1165 Type 2 diabetes mellitus with hyperglycemia: Secondary | ICD-10-CM

## 2022-11-23 LAB — COLOGUARD: COLOGUARD: NEGATIVE

## 2022-12-28 ENCOUNTER — Other Ambulatory Visit: Payer: Self-pay | Admitting: Family Medicine

## 2022-12-28 DIAGNOSIS — F32A Depression, unspecified: Secondary | ICD-10-CM

## 2022-12-30 NOTE — Telephone Encounter (Signed)
Refill request for ALPRAZOLAM 0.5 MG TAB   LOV - 10/24/22 Next OV - 01/24/23 Last refill - 08/25/22 #90/3

## 2022-12-31 NOTE — Telephone Encounter (Signed)
Sent. Thanks.   

## 2023-01-08 ENCOUNTER — Encounter (INDEPENDENT_AMBULATORY_CARE_PROVIDER_SITE_OTHER): Payer: Self-pay

## 2023-01-14 ENCOUNTER — Other Ambulatory Visit: Payer: Self-pay | Admitting: Family Medicine

## 2023-01-24 ENCOUNTER — Encounter: Payer: Self-pay | Admitting: Family Medicine

## 2023-01-24 ENCOUNTER — Ambulatory Visit (INDEPENDENT_AMBULATORY_CARE_PROVIDER_SITE_OTHER): Payer: Medicare Other | Admitting: Family Medicine

## 2023-01-24 DIAGNOSIS — M961 Postlaminectomy syndrome, not elsewhere classified: Secondary | ICD-10-CM

## 2023-01-24 DIAGNOSIS — E1165 Type 2 diabetes mellitus with hyperglycemia: Secondary | ICD-10-CM

## 2023-01-24 DIAGNOSIS — M5442 Lumbago with sciatica, left side: Secondary | ICD-10-CM | POA: Diagnosis not present

## 2023-01-24 DIAGNOSIS — F1190 Opioid use, unspecified, uncomplicated: Secondary | ICD-10-CM | POA: Diagnosis not present

## 2023-01-24 DIAGNOSIS — G8929 Other chronic pain: Secondary | ICD-10-CM

## 2023-01-24 DIAGNOSIS — Z794 Long term (current) use of insulin: Secondary | ICD-10-CM

## 2023-01-24 MED ORDER — OXYCODONE-ACETAMINOPHEN 10-325 MG PO TABS
1.0000 | ORAL_TABLET | Freq: Three times a day (TID) | ORAL | 0 refills | Status: DC | PRN
Start: 2023-01-24 — End: 2023-05-01

## 2023-01-24 MED ORDER — METFORMIN HCL ER 500 MG PO TB24: 500.0000 mg | ORAL_TABLET | Freq: Two times a day (BID) | ORAL | Status: DC

## 2023-01-24 MED ORDER — OXYCODONE-ACETAMINOPHEN 10-325 MG PO TABS: 1.0000 | ORAL_TABLET | Freq: Three times a day (TID) | ORAL | 0 refills | Status: DC | PRN

## 2023-01-24 NOTE — Progress Notes (Unsigned)
Interactive audio and video telecommunications were attempted between this provider and patient, however failed, due to patient having technical difficulties OR patient did not have access to video capability.  We continued and completed visit with audio only.   Virtual Visit via Telephone Note  I connected with patient on 01/24/23  at 8:44 AM  by telephone and verified that I am speaking with the correct person using two identifiers.  Location of patient: home   Location of MD: [facility] Name of referring provider (if blank then none associated): Names per persons and role in encounter:  MD: Ferd Hibbs, Patient: name listed above.  If vitals are not listed, then patient was unable to self-report due to a lack of equipment at home via telehealth   I discussed the limitations, risks, security and privacy concerns of performing an evaluation and management service by telephone and the availability of in person appointments. I also discussed with the patient that there may be a patient responsible charge related to this service. The patient expressed understanding and agreed to proceed.  CC: follow up.   History of Present Illness:   Cologuard neg, d/w pt.    Chronic pain.  Still on opiates at baseline with naproxen at baseline.  Walking less given the heat, trying to walk early AM before it heats up.  Weight is lower in the meantime.  He is still working diet.    He has endocrine f/u pending.  Sugar has been ~130 fasting.  Med list updated.  On less metformin in the meantime.  He feels better on less metformin.  Less fatigued.    Weight loss likely helped his joint pain some.  He is still having pain at baseline, some days more than others.  Woke up in more pain today.  Less pain yesterday.  Not constipated.  Not sedated.  He limits driving, cautions d/w pt.     Observations/Objective:***    Assessment and Plan:***       Need staff to call pt re: 3 months phone visit.   Oxycodone Rx sent x3.   Needs jury duty letter- he got a summons.  Mail hard copy to patient.               Follow Up Instructions: see above.      I discussed the assessment and treatment plan with the patient. The patient was provided an opportunity to ask questions and all were answered. The patient agreed with the plan and demonstrated an understanding of the instructions.   The patient was advised to call back or seek an in-person evaluation if the symptoms worsen or if the condition fails to improve as anticipated.  I provided 19 minutes of non-face-to-face time during this encounter.  Crawford Givens, MD

## 2023-01-26 NOTE — Assessment & Plan Note (Signed)
Chronic pain.  Some better with weight loss.  Not sedated.  GI cautions and options cautions d/w pt.  Continue as is with oxycodone.  Recheck periodically. Needs jury duty letter- he got a summons.  See letter.

## 2023-01-26 NOTE — Assessment & Plan Note (Signed)
Per endo °

## 2023-01-27 ENCOUNTER — Telehealth: Payer: Self-pay

## 2023-01-27 NOTE — Telephone Encounter (Signed)
Please call patient to set up 3 month f/u phone visit

## 2023-01-27 NOTE — Telephone Encounter (Signed)
-----   Message from Crawford Givens sent at 01/26/2023  2:43 PM EDT ----- Please call pt re: 3 months phone visit.  Please schedule.

## 2023-01-27 NOTE — Telephone Encounter (Signed)
Scheduled pt for 05/01/23

## 2023-01-29 ENCOUNTER — Ambulatory Visit (INDEPENDENT_AMBULATORY_CARE_PROVIDER_SITE_OTHER): Payer: Medicare Other

## 2023-01-29 VITALS — Ht 75.0 in | Wt 258.0 lb

## 2023-01-29 DIAGNOSIS — Z0000 Encounter for general adult medical examination without abnormal findings: Secondary | ICD-10-CM | POA: Diagnosis not present

## 2023-01-29 NOTE — Patient Instructions (Signed)
Kevin Huff , Thank you for taking time to come for your Medicare Wellness Visit. I appreciate your ongoing commitment to your health goals. Please review the following plan we discussed and let me know if I can assist you in the future.   Referrals/Orders/Follow-Ups/Clinician Recommendations: Aim for 30 minutes of exercise or brisk walking, 6-8 glasses of water, and 5 servings of fruits and vegetables each day.   Managing Pain Without Opioids Opioids are strong medicines used to treat moderate to severe pain. For some people, especially those who have long-term (chronic) pain, opioids may not be the best choice for pain management due to: Side effects like nausea, constipation, and sleepiness. The risk of addiction (opioid use disorder). The longer you take opioids, the greater your risk of addiction. Pain that lasts for more than 3 months is called chronic pain. Managing chronic pain usually requires more than one approach and is often provided by a team of health care providers working together (multidisciplinary approach). Pain management may be done at a pain management center or pain clinic. How to manage pain without the use of opioids Use non-opioid medicines Non-opioid medicines for pain may include: Over-the-counter or prescription non-steroidal anti-inflammatory drugs (NSAIDs). These may be the first medicines used for pain. They work well for muscle and bone pain, and they reduce swelling. Acetaminophen. This over-the-counter medicine may work well for milder pain but not swelling. Antidepressants. These may be used to treat chronic pain. A certain type of antidepressant (tricyclics) is often used. These medicines are given in lower doses for pain than when used for depression. Anticonvulsants. These are usually used to treat seizures but may also reduce nerve (neuropathic) pain. Muscle relaxants. These relieve pain caused by sudden muscle tightening (spasms). You may also use a pain  medicine that is applied to the skin as a patch, cream, or gel (topical analgesic), such as a numbing medicine. These may cause fewer side effects than medicines taken by mouth. Do certain therapies as directed Some therapies can help with pain management. They include: Physical therapy. You will do exercises to gain strength and flexibility. A physical therapist may teach you exercises to move and stretch parts of your body that are weak, stiff, or painful. You can learn these exercises at physical therapy visits and practice them at home. Physical therapy may also involve: Massage. Heat wraps or applying heat or cold to affected areas. Electrical signals that interrupt pain signals (transcutaneous electrical nerve stimulation, TENS). Weak lasers that reduce pain and swelling (low-level laser therapy). Signals from your body that help you learn to regulate pain (biofeedback). Occupational therapy. This helps you to learn ways to function at home and work with less pain. Recreational therapy. This involves trying new activities or hobbies, such as a physical activity or drawing. Mental health therapy, including: Cognitive behavioral therapy (CBT). This helps you learn coping skills for dealing with pain. Acceptance and commitment therapy (ACT) to change the way you think and react to pain. Relaxation therapies, including muscle relaxation exercises and mindfulness-based stress reduction. Pain management counseling. This may be individual, family, or group counseling.  Receive medical treatments Medical treatments for pain management include: Nerve block injections. These may include a pain blocker and anti-inflammatory medicines. You may have injections: Near the spine to relieve chronic back or neck pain. Into joints to relieve back or joint pain. Into nerve areas that supply a painful area to relieve body pain. Into muscles (trigger point injections) to relieve some painful muscle  conditions. A medical device placed near your spine to help block pain signals and relieve nerve pain or chronic back pain (spinal cord stimulation device). Acupuncture. Follow these instructions at home Medicines Take over-the-counter and prescription medicines only as told by your health care provider. If you are taking pain medicine, ask your health care providers about possible side effects to watch out for. Do not drive or use heavy machinery while taking prescription opioid pain medicine. Lifestyle  Do not use drugs or alcohol to reduce pain. If you drink alcohol, limit how much you have to: 0-1 drink a day for women who are not pregnant. 0-2 drinks a day for men. Know how much alcohol is in a drink. In the [place] one drink equals one 12 oz bottle of beer (355 mL), one 5 oz glass of wine (148 mL), or one 1 oz glass of hard liquor (44 mL). Do not use any products that contain nicotine or tobacco. These products include cigarettes, chewing tobacco, and vaping devices, such as e-cigarettes. If you need help quitting, ask your health care provider. Eat a healthy diet and maintain a healthy weight. Poor diet and excess weight may make pain worse. Eat foods that are high in fiber. These include fresh fruits and vegetables, whole grains, and beans. Limit foods that are high in fat and processed sugars, such as fried and sweet foods. Exercise regularly. Exercise lowers stress and may help relieve pain. Ask your health care provider what activities and exercises are safe for you. If your health care provider approves, join an exercise class that combines movement and stress reduction. Examples include yoga and tai chi. Get enough sleep. Lack of sleep may make pain worse. Lower stress as much as possible. Practice stress reduction techniques as told by your therapist. General instructions Work with all your pain management providers to find the treatments that work best for you. You are an  important member of your pain management team. There are many things you can do to reduce pain on your own. Consider joining an online or in-person support group for people who have chronic pain. Keep all follow-up visits. This is important. Where to find more information You can find more information about managing pain without opioids from: American Academy of Pain Medicine: painmed.org Institute for Chronic Pain: instituteforchronicpain.org American Chronic Pain Association: theacpa.org Contact a health care provider if: You have side effects from pain medicine. Your pain gets worse or does not get better with treatments or home therapy. You are struggling with anxiety or depression. Summary Many types of pain can be managed without opioids. Chronic pain may respond better to pain management without opioids. Pain is best managed when you and a team of health care providers work together. Pain management without opioids may include non-opioid medicines, medical treatments, physical therapy, mental health therapy, and lifestyle changes. Tell your health care providers if your pain gets worse or is not being managed well enough. This information is not intended to replace advice given to you by your health care provider. Make sure you discuss any questions you have with your health care provider. Document Revised: 09/06/2020 Document Reviewed: 09/06/2020 Elsevier Patient Education  2024 [employer].   This is a list of the screening recommended for you and due dates:  Health Maintenance  Topic Date Due   DTaP/Tdap/Td vaccine (1 - Tdap) Never done   Zoster (Shingles) Vaccine (1 of 2) Never done   Yearly kidney function blood test for diabetes  03/17/2021  Yearly kidney health urinalysis for diabetes  03/17/2021   Complete foot exam   03/17/2021   Medicare Annual Wellness Visit  12/29/2022   Flu Shot  09/08/2023*   Hemoglobin A1C  03/14/2023   Eye exam for diabetics  09/12/2023    Cologuard (Stool DNA test)  11/17/2025   Hepatitis C Screening  Completed   HIV Screening  Completed   HPV Vaccine  Aged Out   COVID-19 Vaccine  Discontinued  *Topic was postponed. The date shown is not the original due date.    Advanced directives: (Copy Requested) Please bring a copy of your health care power of attorney and living will to the office to be added to your chart at your convenience.  Next Medicare Annual Wellness Visit scheduled for next year: Yes

## 2023-01-29 NOTE — Progress Notes (Signed)
Subjective:   Kevin Huff is a 57 y.o. male who presents for Medicare Annual/Subsequent preventive examination.  Visit Complete: Virtual  I connected with  Clint Bolder on 01/29/23 by a audio enabled telemedicine application and verified that I am speaking with the correct person using two identifiers.  Patient Location: Home  Provider Location: Office/Clinic  I discussed the limitations of evaluation and management by telemedicine. The patient expressed understanding and agreed to proceed.  Vital Signs: Because this visit was a virtual/telehealth visit, some criteria may be missing or patient reported. Any vitals not documented were not able to be obtained and vitals that have been documented are patient reported.    Review of Systems      Cardiac Risk Factors include: advanced age (>69men, >93 women);hypertension;diabetes mellitus;dyslipidemia;sedentary lifestyle;male gender     Objective:    Today's Vitals   01/29/23 0846  Weight: 258 lb (117 kg)  Height: 6\' 3"  (1.905 m)  PainSc: 7    Body mass index is 32.25 kg/m.     01/29/2023    9:04 AM 12/28/2021   12:05 PM 11/20/2020    2:08 PM 09/09/2019    9:45 AM 09/05/2015    9:04 AM 07/22/2015   12:34 PM  Advanced Directives  Does Patient Have a Medical Advance Directive? Yes Yes Yes Yes Yes No  Type of Estate agent of [place];Living will Healthcare Power of [place];Living will Healthcare Power of [place];Living will Healthcare Power of [place];Living will Living will   Copy of Healthcare Power of Attorney in Chart? No - copy requested No - copy requested No - copy requested No - copy requested    Would patient like information on creating a medical advance directive? No - Patient declined     No - patient declined information    Current Medications (verified) Outpatient Encounter Medications as of 01/29/2023  Medication Sig   ALPRAZolam (XANAX) 0.5 MG tablet TAKE 1 TABLET BY MOUTH 3 TIMES  DAILY AS NEEDED FOR ANXIETY   Blood Glucose Monitoring Suppl (ONETOUCH VERIO) w/Device KIT 1 Units by Does not apply route 3 (three) times daily.   Cholecalciferol (VITAMIN D3) 50 MCG (2000 UT) CHEW Chew by mouth.   Cyanocobalamin (VITAMIN B-12 PO) Take 1,000 mg by mouth daily.   Fexofenadine HCl (ALLEGRA PO) Take by mouth daily.   fluticasone (FLONASE) 50 MCG/ACT nasal spray Place 2 sprays into both nostrils daily.   glipiZIDE (GLUCOTROL) 10 MG tablet TAKE 1 TABLET BY MOUTH TWICE DAILY BEFORE A MEAL   glucose blood (ONETOUCH VERIO) test strip Use as instructed   insulin NPH-regular Human (NOVOLIN 70/30) (70-30) 100 UNIT/ML injection Inject 16 Units into the skin 2 (two) times daily with a meal.   lisinopril (ZESTRIL) 20 MG tablet TAKE 1 TABLET(20 MG) BY MOUTH DAILY   metFORMIN (GLUCOPHAGE-XR) 500 MG 24 hr tablet Take 1 tablet (500 mg total) by mouth 2 (two) times daily.   naproxen (NAPROSYN) 500 MG tablet TAKE 1 TABLET BY MOUTH TWICE A DAY AS NEEDED TAKE WITH FOOD.   oxyCODONE-acetaminophen (PERCOCET) 10-325 MG tablet Take 1 tablet by mouth every 8 (eight) hours as needed for pain.   oxyCODONE-acetaminophen (PERCOCET) 10-325 MG tablet Take 1 tablet by mouth every 8 (eight) hours as needed. for pain   oxyCODONE-acetaminophen (PERCOCET) 10-325 MG tablet Take 1 tablet by mouth every 8 (eight) hours as needed for pain. Fill on/after 60 days from prior rx   traZODone (DESYREL) 100 MG tablet TAKE 2  TABLETS BY MOUTH AT BEDTIME AS NEEDED FOR SLEEP   pioglitazone (ACTOS) 15 MG tablet Take by mouth.   No facility-administered encounter medications on file as of 01/29/2023.    Allergies (verified) Duloxetine, Codeine, Sertraline hcl, and Penicillins   History: Past Medical History:  Diagnosis Date   Chronic back pain greater than 3 months duration    Depression    Diabetes mellitus without complication (HCC)    Hypertension    Past Surgical History:  Procedure Laterality Date   BACK SURGERY      KNEE SURGERY     Family History  Problem Relation Age of Onset   Cancer Mother    Hyperlipidemia Father    Hypertension Father    Diabetes Father    Heart disease Father    Colon cancer Neg Hx    Social History   Socioeconomic History   Marital status: Married    Spouse name: Not on file   Number of children: Not on file   Years of education: Not on file   Highest education level: Not on file  Occupational History   Not on file  Tobacco Use   Smoking status: Never   Smokeless tobacco: Never  Vaping Use   Vaping status: Never Used  Substance and Sexual Activity   Alcohol use: No   Drug use: Never   Sexual activity: Yes    Partners: Female  Other Topics Concern   Not on file  Social History Narrative   From [facility]    Married 1983   Social Determinants of Health   Financial Resource Strain: Low Risk  (01/29/2023)   Overall Financial Resource Strain (CARDIA)    Difficulty of Paying Living Expenses: Not hard at all  Food Insecurity: No Food Insecurity (01/29/2023)   Hunger Vital Sign    Worried About Running Out of Food in the Last Year: Never true    Ran Out of Food in the Last Year: Never true  Transportation Needs: No Transportation Needs (01/29/2023)   PRAPARE - Administrator, Civil Service (Medical): No    Lack of Transportation (Non-Medical): No  Physical Activity: Insufficiently Active (01/29/2023)   Exercise Vital Sign    Days of Exercise per Week: 3 days    Minutes of Exercise per Session: 10 min  Stress: No Stress Concern Present (01/29/2023)   [employer] of Occupational Health - Occupational Stress Questionnaire    Feeling of Stress : Not at all  Social Connections: Unknown (01/29/2023)   Social Connection and Isolation Panel [NHANES]    Frequency of Communication with Friends and Family: More than three times a week    Frequency of Social Gatherings with Friends and Family: More than three times a week    Attends Religious  Services: Patient declined    Database administrator or Organizations: No    Attends Engineer, structural: Never    Marital Status: Married    Tobacco Counseling Counseling given: Not Answered   Clinical Intake:  Pre-visit preparation completed: Yes  Pain : 0-10 Pain Score: 7  Pain Type: Chronic pain (Broke back 16 years) Pain Location: Back Pain Descriptors / Indicators: Aching, Burning, Stabbing, Sharp Pain Onset: More than a month ago (16 years)     BMI - recorded: 32.25 Nutritional Status: BMI > 30  Obese Nutritional Risks: None Diabetes: Yes CBG done?: No Did pt. bring in CBG monitor from home?: No  How often do you need to have  someone help you when you read instructions, pamphlets, or other written materials from your doctor or pharmacy?: 1 - Never  Interpreter Needed?: No  Information entered by :: C.Jahfari Ambers LPN   Activities of Daily Living    01/29/2023    9:05 AM  In your present state of health, do you have any difficulty performing the following activities:  Hearing? 0  Vision? 0  Difficulty concentrating or making decisions? 0  Walking or climbing stairs? 1  Comment Back pain  Dressing or bathing? 1  Comment wife assists  Doing errands, shopping? 1  Preparing Food and eating ? Y  Comment wife assists  Using the Toilet? Y  In the past six months, have you accidently leaked urine? N  Do you have problems with loss of bowel control? N  Managing your Medications? N  Managing your Finances? N  Housekeeping or managing your Housekeeping? Y  Comment wife assists    Patient Care Team: Joaquim Nam, MD as PCP - General (Family Medicine) Sherlon Handing, MD as Consulting Physician (Internal Medicine) Marcell Barlow Kinnie Scales, MD as Referring Physician (Ophthalmology)  Indicate any recent Medical Services you may have received from other than [facility] providers in the past year (date may be approximate).     Assessment:   This is a  routine wellness examination for [place].  Hearing/Vision screen Hearing Screening - Comments:: Denies hearing difficulties   Vision Screening - Comments:: No vision issues - Had Retina Scan done by Dr.McConnell endocrinology - [facility]les[facility] to [facility] 2 years ago.  Dietary issues and exercise activities discussed:     Goals Addressed             This Visit's Progress    Patient Stated       Get off diabetic medication       Depression Screen    01/29/2023    8:55 AM 01/24/2023    8:31 AM 10/24/2022    8:26 AM 12/28/2021   12:07 PM 11/20/2020    2:11 PM 10/31/2020    9:50 AM 09/09/2019    9:50 AM  PHQ 2/9 Scores  PHQ - 2 Score 0 0 0 0 0 0 0  PHQ- 9 Score 0 1 2  0 0 0    Fall Risk    01/29/2023    9:02 AM 01/24/2023    8:32 AM 10/24/2022    8:25 AM 07/25/2022    8:30 AM 12/28/2021   12:06 PM  Fall Risk   Falls in the past year? 0 1 1 0 0  Number falls in past yr: 0 0 0 0 0  Injury with Fall? 0 0 0 0 0  Risk for fall due to : No Fall Risks Impaired balance/gait;History of fall(s) No Fall Risks No Fall Risks Impaired balance/gait;Impaired mobility;Medication side effect  Follow up Falls prevention discussed;Falls evaluation completed Falls evaluation completed Falls evaluation completed Falls evaluation completed Falls evaluation completed;Education provided;Falls prevention discussed    MEDICARE RISK AT HOME: Medicare Risk at Home Any stairs in or around the home?: Yes If so, are there any without handrails?: No Home free of loose throw rugs in walkways, pet beds, electrical cords, etc?: Yes Adequate lighting in your home to reduce risk of falls?: Yes Life alert?: No Use of a cane, walker or w/c?: Yes (cane) Grab bars in the bathroom?: Yes Shower chair or bench in shower?: Yes Elevated toilet seat or a handicapped toilet?: Yes  TIMED UP AND GO:  Was the test performed?  No    Cognitive Function:    11/20/2020    2:16 PM 09/09/2019    9:53 AM  MMSE - Mini  Mental State Exam  Not completed: Refused   Orientation to time  5  Orientation to Place  5  Registration  3  Attention/ Calculation  5  Recall  3  Language- repeat  1        01/29/2023    9:07 AM 12/28/2021   12:09 PM  6CIT Screen  What Year? 0 points 0 points  What month? 0 points 0 points  What time? 0 points 0 points  Count back from 20 0 points 0 points  Months in reverse 0 points 0 points  Repeat phrase 0 points 6 points  Total Score 0 points 6 points    Immunizations Immunization History  Administered Date(s) Administered   Fluad Quad(high Dose 65+) 05/11/2019   Influenza,inj,Quad PF,6+ Mos 02/27/2018, 04/07/2020, 03/30/2021   Pneumococcal Polysaccharide-23 11/08/2017    TDAP status: Due, Education has been provided regarding the importance of this vaccine. Advised may receive this vaccine at local pharmacy or Health Dept. Aware to provide a copy of the vaccination record if obtained from local pharmacy or Health Dept. Verbalized acceptance and understanding.  Flu Vaccine status: Declined, Education has been provided regarding the importance of this vaccine but patient still declined. Advised may receive this vaccine at local pharmacy or Health Dept. Aware to provide a copy of the vaccination record if obtained from local pharmacy or Health Dept. Verbalized acceptance and understanding.  Pneumococcal vaccine status: Declined,  Education has been provided regarding the importance of this vaccine but patient still declined. Advised may receive this vaccine at local pharmacy or Health Dept. Aware to provide a copy of the vaccination record if obtained from local pharmacy or Health Dept. Verbalized acceptance and understanding.   Covid-19 vaccine status: Declined, Education has been provided regarding the importance of this vaccine but patient still declined. Advised may receive this vaccine at local pharmacy or Health Dept.or vaccine clinic. Aware to provide a copy of the  vaccination record if obtained from local pharmacy or Health Dept. Verbalized acceptance and understanding.  Qualifies for Shingles Vaccine? Yes   Zostavax completed No   Shingrix Completed?: No.    Education has been provided regarding the importance of this vaccine. Patient has been advised to call insurance company to determine out of pocket expense if they have not yet received this vaccine. Advised may also receive vaccine at local pharmacy or Health Dept. Verbalized acceptance and understanding.  Screening Tests Health Maintenance  Topic Date Due   DTaP/Tdap/Td (1 - Tdap) Never done   Zoster Vaccines- Shingrix (1 of 2) Never done   Diabetic kidney evaluation - eGFR measurement  03/17/2021   Diabetic kidney evaluation - Urine ACR  03/17/2021   FOOT EXAM  03/17/2021   INFLUENZA VACCINE  09/08/2023 (Originally 01/09/2023)   HEMOGLOBIN A1C  03/14/2023   OPHTHALMOLOGY EXAM  09/12/2023   Medicare Annual Wellness (AWV)  01/29/2024   Fecal DNA (Cologuard)  11/17/2025   Hepatitis C Screening  Completed   HIV Screening  Completed   HPV VACCINES  Aged Out   COVID-19 Vaccine  Discontinued    Health Maintenance  Health Maintenance Due  Topic Date Due   DTaP/Tdap/Td (1 - Tdap) Never done   Zoster Vaccines- Shingrix (1 of 2) Never done   Diabetic kidney evaluation - eGFR measurement  03/17/2021  Diabetic kidney evaluation - Urine ACR  03/17/2021   FOOT EXAM  03/17/2021    Colorectal cancer screening: Type of screening: Cologuard. Completed 11/18/22. Repeat every 3 years  Lung Cancer Screening: (Low Dose CT Chest recommended if Age 72-80 years, 20 pack-year currently smoking OR have quit w/in 15years.) does not qualify.   Lung Cancer Screening Referral:    Additional Screening:  Hepatitis C Screening: does qualify; Completed 02/27/18  Vision Screening: Recommended annual ophthalmology exams for early detection of glaucoma and other disorders of the eye. Is the patient up to date  with their annual eye exam?  Yes  Who is the provider or what is the name of the office in which the patient attends annual eye exams? Dr.McConnell performed Retina Scan. If pt is not established with a provider, would they like to be referred to a provider to establish care? No .   Dental Screening: Recommended annual dental exams for proper oral hygiene  Diabetic Foot Exam: Diabetic Foot Exam: Completed DUE  Community Resource Referral / Chronic Care Management: CRR required this visit?  No   CCM required this visit?  No     Plan:     I have personally reviewed and noted the following in the patient's chart:   Medical and social history Use of alcohol, tobacco or illicit drugs  Current medications and supplements including opioid prescriptions. Patient is currently taking opioid prescriptions. Information provided to patient regarding non-opioid alternatives. Patient advised to discuss non-opioid treatment plan with their provider. Functional ability and status Nutritional status Physical activity Advanced directives List of other physicians Hospitalizations, surgeries, and ER visits in previous 12 months Vitals Screenings to include cognitive, depression, and falls Referrals and appointments  In addition, I have reviewed and discussed with patient certain preventive protocols, quality metrics, and best practice recommendations. A written personalized care plan for preventive services as well as general preventive health recommendations were provided to patient.     Maryan Puls, LPN   [id number]/[id number]   After Visit Summary: (MyChart) Due to this being a telephonic visit, the after visit summary with patients personalized plan was offered to patient via MyChart   Nurse Notes: Vaccinations: declines all Influenza vaccine: recommend every Fall Pneumococcal vaccine: recommend once per lifetime Prevnar-20 Tdap vaccine: recommend every 10 years Shingles vaccine: recommend  Shingrix which is 2 doses 2-6 months apart and over 90% effective     Covid-19: recommend 2 doses one month apart with a booster 6 months later

## 2023-02-17 ENCOUNTER — Telehealth: Payer: Self-pay | Admitting: Family Medicine

## 2023-02-17 NOTE — Telephone Encounter (Signed)
Patient called in stated that he has a big boil on  left cheek  and would like for the nurse to give him a call if possible.

## 2023-02-17 NOTE — Telephone Encounter (Signed)
Patient called back right before lunch and was advised by front staff to go to UC or make in person visit here. Patient said he would go to UC today.

## 2023-02-17 NOTE — Telephone Encounter (Signed)
I need to see the lesion, rec: in person eval either here or at [facility].  In person would be best option.

## 2023-02-17 NOTE — Telephone Encounter (Signed)
Called and spoke with patient, advised patient would need an evaluation for boil on left buttcheek. Offered next available appt at our clinic patient declined and refused to make appt. Patient has transportation for today and he advised this is the only day he would be able to have it looked at. Advised due to this, evaluation at an Urgent care today may be his best option. Patient was uncooperative and short on the phone. Advised patient he must be evaluated in order to be treated appropriately. He insisted message be sent to Dr. Para March for advice. Please advise.

## 2023-04-30 ENCOUNTER — Other Ambulatory Visit: Payer: Self-pay | Admitting: Family Medicine

## 2023-04-30 DIAGNOSIS — E1165 Type 2 diabetes mellitus with hyperglycemia: Secondary | ICD-10-CM

## 2023-05-01 ENCOUNTER — Encounter: Payer: Self-pay | Admitting: Family Medicine

## 2023-05-01 ENCOUNTER — Ambulatory Visit (INDEPENDENT_AMBULATORY_CARE_PROVIDER_SITE_OTHER): Payer: Medicare Other | Admitting: Family Medicine

## 2023-05-01 VITALS — BP 114/79 | HR 88 | Ht 75.0 in | Wt 220.0 lb

## 2023-05-01 DIAGNOSIS — M5442 Lumbago with sciatica, left side: Secondary | ICD-10-CM | POA: Diagnosis not present

## 2023-05-01 DIAGNOSIS — F1190 Opioid use, unspecified, uncomplicated: Secondary | ICD-10-CM

## 2023-05-01 DIAGNOSIS — L089 Local infection of the skin and subcutaneous tissue, unspecified: Secondary | ICD-10-CM | POA: Diagnosis not present

## 2023-05-01 DIAGNOSIS — G8929 Other chronic pain: Secondary | ICD-10-CM | POA: Diagnosis not present

## 2023-05-01 DIAGNOSIS — M961 Postlaminectomy syndrome, not elsewhere classified: Secondary | ICD-10-CM | POA: Diagnosis not present

## 2023-05-01 MED ORDER — OXYCODONE-ACETAMINOPHEN 10-325 MG PO TABS: 1.0000 | ORAL_TABLET | Freq: Three times a day (TID) | ORAL | 0 refills | Status: DC | PRN

## 2023-05-01 MED ORDER — DOXYCYCLINE HYCLATE 100 MG PO TABS: 100.0000 mg | ORAL_TABLET | Freq: Two times a day (BID) | ORAL | 0 refills | Status: DC

## 2023-05-01 NOTE — Progress Notes (Signed)
Interactive audio and video telecommunications were attempted between this provider and patient, however failed, due to patient having technical difficulties OR patient did not have access to video capability.  We continued and completed visit with audio only.   Virtual Visit via Telephone Note  I connected with patient on 05/01/23  at 8:48 AM  by telephone and verified that I am speaking with the correct person using two identifiers.  Location of patient: home   Location of MD: [facility] Name of referring provider (if blank then none associated): Names per persons and role in encounter:  MD: Ferd Hibbs, Patient: name listed above.  If vitals are not listed, then patient was unable to self-report due to a lack of equipment at home via telehealth   I discussed the limitations, risks, security and privacy concerns of performing an evaluation and management service by telephone and the availability of in person appointments. I also discussed with the patient that there may be a patient responsible charge related to this service. The patient expressed understanding and agreed to proceed.  CC: follow up.   History of Present Illness:  Chronic pain.  Complicated by skin infection below.  Still on opiates at baseline with naproxen at baseline.  He is still having pain at baseline, some days more than others.  Not constipated.  "I can live with a 6/10 pain."  About a "6" today.  More pain prev with abscess.    DM. He had to reschedule with endo mult times.  D/w pt.  He is working on his weight and down to 220 lbs.  Sugar has been controlled in the meantime.    He still has a boil- it was lanced at [facility] x2.  Has been on doxycycline.  It is still draining.  No fevers.  It is getting smaller in the meantime and can put weight on the area now.  The skin isn't puffy and hard like prev.  He is still using warm compresses.     Observations/Objective: Nad Speech wnl  Assessment and  Plan:  Chronic pain.  Continue work on weight reduction as that helps with gait.  Continue naproxen and opiates at baseline.  He is not pain-free but his pain is more manageable with those medications.  Not sedated and okay for outpatient follow-up and he agrees with plan.  Skin infection. If he has any worsening when done with abx, then he'll restart doxy and update [place] and we can refer to general surgery.    Follow Up Instructions: see above.    I discussed the assessment and treatment plan with the patient. The patient was provided an opportunity to ask questions and all were answered. The patient agreed with the plan and demonstrated an understanding of the instructions.   The patient was advised to call back or seek an in-person evaluation if the symptoms worsen or if the condition fails to improve as anticipated.  I provided 15 minutes of non-face-to-face time during this encounter.  Crawford Givens, MD

## 2023-05-04 DIAGNOSIS — L089 Local infection of the skin and subcutaneous tissue, unspecified: Secondary | ICD-10-CM | POA: Insufficient documentation

## 2023-05-04 NOTE — Assessment & Plan Note (Signed)
  Skin infection. If he has any worsening when done with abx, then he'll restart doxy and update [place] and we can refer to general surgery.

## 2023-05-04 NOTE — Assessment & Plan Note (Signed)
Chronic pain.  Continue work on weight reduction as that helps with gait.  Continue naproxen and opiates at baseline.  He is not pain-free but his pain is more manageable with those medications.  Not sedated and okay for outpatient follow-up and he agrees with plan.

## 2023-05-05 ENCOUNTER — Telehealth: Payer: Self-pay

## 2023-05-05 NOTE — Telephone Encounter (Signed)
Patient returned call, scheduled him as directed

## 2023-05-05 NOTE — Telephone Encounter (Signed)
Left message for patient to call and schedule a 3 month f/u phone appointment per Dr. Para March

## 2023-05-30 ENCOUNTER — Other Ambulatory Visit: Payer: Self-pay | Admitting: Family Medicine

## 2023-05-30 DIAGNOSIS — F419 Anxiety disorder, unspecified: Secondary | ICD-10-CM

## 2023-05-30 NOTE — Telephone Encounter (Signed)
Last office visit: 05/01/2023 Next office visit: 07/31/2023 Last refill: ALPRAZOLAM 0.5 MG TAB 12/31/2022 90 tablets 3 refills

## 2023-06-26 ENCOUNTER — Other Ambulatory Visit: Payer: Self-pay | Admitting: Family Medicine

## 2023-06-26 DIAGNOSIS — G4700 Insomnia, unspecified: Secondary | ICD-10-CM

## 2023-07-30 ENCOUNTER — Other Ambulatory Visit: Payer: Self-pay | Admitting: Family Medicine

## 2023-07-30 DIAGNOSIS — F1190 Opioid use, unspecified, uncomplicated: Secondary | ICD-10-CM

## 2023-07-30 DIAGNOSIS — M961 Postlaminectomy syndrome, not elsewhere classified: Secondary | ICD-10-CM

## 2023-07-30 DIAGNOSIS — G8929 Other chronic pain: Secondary | ICD-10-CM

## 2023-07-30 MED ORDER — OXYCODONE-ACETAMINOPHEN 10-325 MG PO TABS: 1.0000 | ORAL_TABLET | Freq: Three times a day (TID) | ORAL | 0 refills | Status: DC | PRN

## 2023-07-30 NOTE — Telephone Encounter (Signed)
 Rx sent.  Please update patient. I would like the upcoming visit or the following visit to be in person.  It would be reasonable to try to update his back films here with xrays and get any needed labs/update a chronic pain rx contract.  Thanks.

## 2023-07-30 NOTE — Telephone Encounter (Signed)
 Patient appt cancelled today due to weather. Requesting refill due to being out until rescheduled appt on 2/21

## 2023-07-31 ENCOUNTER — Telehealth: Payer: Medicare Other | Admitting: Family Medicine

## 2023-07-31 NOTE — Telephone Encounter (Signed)
 Please call patient to see if he can come in tomorrow as in person so that we can get an xray. Thank you.

## 2023-07-31 NOTE — Telephone Encounter (Signed)
 Changed patients appt to in person

## 2023-08-01 ENCOUNTER — Ambulatory Visit
Admission: RE | Admit: 2023-08-01 | Discharge: 2023-08-01 | Disposition: A | Discharge status: Home / Self Care | Payer: Medicare Other | Source: Ambulatory Visit | Attending: Family Medicine | Admitting: Family Medicine

## 2023-08-01 ENCOUNTER — Ambulatory Visit (INDEPENDENT_AMBULATORY_CARE_PROVIDER_SITE_OTHER): Payer: Medicare Other | Admitting: Family Medicine

## 2023-08-01 ENCOUNTER — Encounter: Payer: Self-pay | Admitting: Family Medicine

## 2023-08-01 ENCOUNTER — Telehealth: Payer: Self-pay | Admitting: Family Medicine

## 2023-08-01 VITALS — BP 142/78 | HR 83 | Temp 97.7°F | Ht 75.0 in | Wt 208.2 lb

## 2023-08-01 DIAGNOSIS — G8929 Other chronic pain: Secondary | ICD-10-CM | POA: Diagnosis not present

## 2023-08-01 DIAGNOSIS — E538 Deficiency of other specified B group vitamins: Secondary | ICD-10-CM

## 2023-08-01 DIAGNOSIS — M25552 Pain in left hip: Secondary | ICD-10-CM

## 2023-08-01 DIAGNOSIS — M961 Postlaminectomy syndrome, not elsewhere classified: Secondary | ICD-10-CM | POA: Diagnosis not present

## 2023-08-01 DIAGNOSIS — E1165 Type 2 diabetes mellitus with hyperglycemia: Secondary | ICD-10-CM

## 2023-08-01 DIAGNOSIS — M545 Low back pain, unspecified: Secondary | ICD-10-CM

## 2023-08-01 DIAGNOSIS — L089 Local infection of the skin and subcutaneous tissue, unspecified: Secondary | ICD-10-CM

## 2023-08-01 DIAGNOSIS — R011 Cardiac murmur, unspecified: Secondary | ICD-10-CM

## 2023-08-01 DIAGNOSIS — Z794 Long term (current) use of insulin: Secondary | ICD-10-CM

## 2023-08-01 DIAGNOSIS — M25559 Pain in unspecified hip: Secondary | ICD-10-CM

## 2023-08-01 LAB — MICROALBUMIN / CREATININE URINE RATIO
Creatinine,U: 21.4 mg/dL
Microalb Creat Ratio: 1368.8 mg/g — ABNORMAL HIGH (ref 0.0–30.0)
Microalb, Ur: 29.3 mg/dL — ABNORMAL HIGH (ref 0.0–1.9)

## 2023-08-01 LAB — COMPREHENSIVE METABOLIC PANEL
ALT: 16 U/L (ref 0–53)
AST: 13 U/L (ref 0–37)
Albumin: 3.8 g/dL (ref 3.5–5.2)
Alkaline Phosphatase: 81 U/L (ref 39–117)
BUN: 12 mg/dL (ref 6–23)
CO2: 27 meq/L (ref 19–32)
Calcium: 8.9 mg/dL (ref 8.4–10.5)
Chloride: 93 meq/L — ABNORMAL LOW (ref 96–112)
Creatinine, Ser: 0.8 mg/dL (ref 0.40–1.50)
GFR: 96.58 mL/min (ref >60.00)
Glucose, Bld: 394 mg/dL — ABNORMAL HIGH (ref 70–99)
Potassium: 5.6 meq/L — ABNORMAL HIGH (ref 3.5–5.1)
Sodium: 129 meq/L — ABNORMAL LOW (ref 135–145)
Total Bilirubin: 1.3 mg/dL — ABNORMAL HIGH (ref 0.2–1.2)
Total Protein: 6.5 g/dL (ref 6.0–8.3)

## 2023-08-01 LAB — LIPID PANEL
Cholesterol: 157 mg/dL (ref 0–200)
HDL: 40.8 mg/dL (ref >39.00)
LDL Cholesterol: 95 mg/dL (ref 0–99)
NonHDL: 116.16
Total CHOL/HDL Ratio: 4
Triglycerides: 106 mg/dL (ref 0.0–149.0)
VLDL: 21.2 mg/dL (ref 0.0–40.0)

## 2023-08-01 LAB — CBC WITH DIFFERENTIAL/PLATELET
Basophils Absolute: 0 10*3/uL (ref 0.0–0.1)
Basophils Relative: 0.5 % (ref 0.0–3.0)
Eosinophils Absolute: 0 10*3/uL (ref 0.0–0.7)
Eosinophils Relative: 0.2 % (ref 0.0–5.0)
HCT: 44.7 % (ref 39.0–52.0)
Hemoglobin: 15.2 g/dL (ref 13.0–17.0)
Lymphocytes Relative: 13 % (ref 12.0–46.0)
Lymphs Abs: 1.2 10*3/uL (ref 0.7–4.0)
MCHC: 34 g/dL (ref 30.0–36.0)
MCV: 84.5 fL (ref 78.0–100.0)
Monocytes Absolute: 0.3 10*3/uL (ref 0.1–1.0)
Monocytes Relative: 3.5 % (ref 3.0–12.0)
Neutro Abs: 7.9 10*3/uL — ABNORMAL HIGH (ref 1.4–7.7)
Neutrophils Relative %: 82.8 % — ABNORMAL HIGH (ref 43.0–77.0)
Platelets: 305 10*3/uL (ref 150.0–400.0)
RBC: 5.29 Mil/uL (ref 4.22–5.81)
RDW: 14.1 % (ref 11.5–15.5)
WBC: 9.5 10*3/uL (ref 4.0–10.5)

## 2023-08-01 LAB — TSH: TSH: 1.35 u[IU]/mL (ref 0.35–5.50)

## 2023-08-01 LAB — VITAMIN B12: Vitamin B-12: 700 pg/mL (ref 211–911)

## 2023-08-01 MED ORDER — ONETOUCH VERIO VI STRP: ORAL_STRIP | 12 refills | Status: DC

## 2023-08-01 NOTE — Telephone Encounter (Signed)
 Test strips where sent to the pharmacy for patient but the insurance company is needing to know how many times a day should patient be testing his blood sugar

## 2023-08-01 NOTE — Progress Notes (Signed)
 DM2.  Recently with sig higher A1c.  Dismissed from endocrine clinic.  He had not been taking his meds as rx'd.  Weight loss noted, d/w pt about weight loss related to higher sugar.  Endocrine referral placed.  D/w pt about gradual inc in insulin from 12 units BID to 16 units BID over the next few days, with inc in 1 unit AM/PM per day.   Chronic back pain.  Still on oxycodone at baseline.  Taking naproxen at baseline.  Not sedated.  He had been taking CBD gummies.  D/w pt.  Pain radiating down the back and into L hip. Having sig L hip pain.  Due for f/u imaging.   He had a cyst on his buttock.  Had sig pain from that.  Prev lanced.  Not on abx currently.  He wasn't eating as much when on abx.  Prev constipation on abx, resolved now. See exam.   Meds, vitals, and allergies reviewed.   ROS: Per HPI unless specifically indicated in ROS section   Nad [place]ck supple, no LA RRR with SEM that radiates to the carotids.  Ctab Abd soft not ttp Midline lower back scar.  No bruising. R hip with pain on ROM.  Old scar L buttock.   L hip pain on ROM.  Waking with cane.   Diabetic foot exam: Normal inspection No skin breakdown No calluses  Normal DP pulses Normal sensation to light touch and monofilament except for chronic dec in sensation on L foot from prior back condition . Nails normal

## 2023-08-01 NOTE — Patient Instructions (Addendum)
 Go to the lab on the way out (blood, urine, xrays).   If you have mychart we'll likely use that to update you.     You should get a call about seeing endocrine clinic.   Start checking your sugar at home.  Update me next week. Add 1 unit of insulin in the AM and PM in the meantime, to get back to 16 units twice a day.   Take care.  Glad to see you.

## 2023-08-01 NOTE — Telephone Encounter (Signed)
 Resent rx to use as instructed to check sugar up to 3 times per day, h/o DM2 treated with insulin.  He doesn't have to check 3 times per day but could if needed.  Thanks.

## 2023-08-03 DIAGNOSIS — R011 Cardiac murmur, unspecified: Secondary | ICD-10-CM | POA: Insufficient documentation

## 2023-08-03 LAB — DRUG MONITORING, PANEL 8 WITH CONFIRMATION, URINE
6 Acetylmorphine: NEGATIVE ng/mL (ref <10)
Alcohol Metabolites: NEGATIVE ng/mL (ref <500)
Alphahydroxyalprazolam: 91 ng/mL — ABNORMAL HIGH (ref <25)
Alphahydroxymidazolam: NEGATIVE ng/mL (ref <50)
Alphahydroxytriazolam: NEGATIVE ng/mL (ref <50)
Aminoclonazepam: NEGATIVE ng/mL (ref <25)
Amphetamines: NEGATIVE ng/mL (ref <500)
Benzodiazepines: POSITIVE ng/mL — AB (ref <100)
Buprenorphine, Urine: NEGATIVE ng/mL (ref <5)
Cocaine Metabolite: NEGATIVE ng/mL (ref <150)
Codeine: NEGATIVE ng/mL (ref <50)
Creatinine: 22.4 mg/dL (ref >=20.0)
Hydrocodone: NEGATIVE ng/mL (ref <50)
Hydromorphone: NEGATIVE ng/mL (ref <50)
Hydroxyethylflurazepam: NEGATIVE ng/mL (ref <50)
Lorazepam: NEGATIVE ng/mL (ref <50)
MDMA: NEGATIVE ng/mL (ref <500)
Marijuana Metabolite: 766 ng/mL — ABNORMAL HIGH (ref <5)
Marijuana Metabolite: POSITIVE ng/mL — AB (ref <20)
Morphine: NEGATIVE ng/mL (ref <50)
Nordiazepam: NEGATIVE ng/mL (ref <50)
Norhydrocodone: NEGATIVE ng/mL (ref <50)
Noroxycodone: 1692 ng/mL — ABNORMAL HIGH (ref <50)
Opiates: NEGATIVE ng/mL (ref <100)
Oxazepam: NEGATIVE ng/mL (ref <50)
Oxidant: NEGATIVE ug/mL (ref <200)
Oxycodone: 1181 ng/mL — ABNORMAL HIGH (ref <50)
Oxycodone: POSITIVE ng/mL — AB (ref <100)
Oxymorphone: 602 ng/mL — ABNORMAL HIGH (ref <50)
Temazepam: NEGATIVE ng/mL (ref <50)
pH: 5.6 (ref 4.5–9.0)

## 2023-08-03 LAB — DM TEMPLATE

## 2023-08-03 NOTE — Assessment & Plan Note (Signed)
 Presumed AS with murmur, d/w pt.  Echo ordered. CTAB and okay for outpatient f/u.

## 2023-08-03 NOTE — Assessment & Plan Note (Signed)
 And hip pain.  See notes on labs and imaging.  Continue oxycodone at baseline. Not sedated.  Continue naproxen for now.  We can see about options after reviewing his back films.  He isn't a candidate for invasive treatment now given his DM2.  41 minutes were devoted to patient care in this encounter (this includes time spent reviewing the patient's file/history, interviewing and examining the patient, counseling/reviewing plan with patient).

## 2023-08-03 NOTE — Assessment & Plan Note (Signed)
 Refer to endo. Need to start checking sugar at home.  Update me next week. Add 1 unit of insulin in the AM and PM in the meantime, to get back to 16 units twice a day.

## 2023-08-03 NOTE — Assessment & Plan Note (Signed)
 See exam, old scarring, resolved now.

## 2023-08-04 ENCOUNTER — Other Ambulatory Visit: Payer: Self-pay | Admitting: Family Medicine

## 2023-08-04 DIAGNOSIS — Z794 Long term (current) use of insulin: Secondary | ICD-10-CM

## 2023-08-05 ENCOUNTER — Telehealth: Payer: Self-pay | Admitting: Family Medicine

## 2023-08-05 ENCOUNTER — Ambulatory Visit: Payer: Self-pay | Admitting: Family Medicine

## 2023-08-05 NOTE — Telephone Encounter (Signed)
 If he is acutely ill, then I think recheck in person is the best option.

## 2023-08-05 NOTE — Telephone Encounter (Signed)
 Copied from CRM [id number]. Topic: Clinical - Red Word Triage >> Aug 05, 2023 12:37 PM Kevin Huff wrote: Red Word that prompted transfer to Nurse Triage: Patient states he has the flu and a fever of 101 and states he pain level is 12/10   Chief Complaint: Fever Symptoms: earache, nasal congestion Frequency: constant Pertinent Negatives: Patient denies  Disposition: [] ED /[] Urgent Care (no appt availability in office) / [] Appointment(In office/virtual)/ []  [facility]/ [] Home Care/ [x] Refused Recommended Disposition /[] [place] [facility]/ []  Follow-up with PCP Additional Notes: Patient reports fever since yesterday, unsure of what he should take. RN advising Tylenol or Ibuprofen, but patient insistent on "having something called in" RN advising office visit, pt refusing stating that he was just in the office on Friday. Will route to practice for follow-up.   Reason for Disposition  [1] Fever > 100.0 F (37.8 C) AND [2] diabetes mellitus or weak immune system (e.g., HIV positive, cancer chemo, splenectomy, organ transplant, chronic steroids)  Answer Assessment - Initial Assessment Questions 1. TEMPERATURE: "What is the most recent temperature?"  "How was it measured?"      101 F  2. ONSET: "When did the fever start?"      Yesterday  3. CHILLS: "Do you have chills?" If yes: "How bad are they?"  (e.g., none, mild, moderate, severe)   - NONE: no chills   - MILD: feeling cold   - MODERATE: feeling very cold, some shivering (feels better under a thick blanket)   - SEVERE: feeling extremely cold with shaking chills (general body shaking, rigors; even under a thick blanket)      Mild-Moderate  4. OTHER SYMPTOMS: "Do you have any other symptoms besides the fever?"  (e.g., abdomen pain, cough, diarrhea, earache, headache, sore throat, urination pain)     Nasal congestion, earache, and headache  5. CAUSE: If there are no symptoms, ask: "What do you think is causing the fever?"       Unsure of cause  6. CONTACTS: "Does anyone else in the family have an infection?"     No  7. TREATMENT: "What have you done so far to treat this fever?" (e.g., medications)     Currently taking Percocet for pain  8. IMMUNOCOMPROMISE: "Do you have of the following: diabetes, HIV positive, splenectomy, cancer chemotherapy, chronic steroid treatment, transplant patient, etc."     Diabetes  9. PREGNANCY: "Is there any chance you are pregnant?" "When was your last menstrual period?"     N/a  10. TRAVEL: "Have you traveled out of the country in the last month?" (e.g., travel history, exposures)       No  Protocols used: [facility]

## 2023-08-05 NOTE — Telephone Encounter (Signed)
 Copied from CRM [id number]. Topic: General - Other >> Aug 05, 2023 12:41 PM Kevin Huff wrote: Reason for CRM: Patient wants to relay the message to Dr. Para March that he needs a walker as he cannot hold himself up any longer, patient states he would like a nice walker that will hold him and that he can sit on if needed "not a cheap walker" states the patient.

## 2023-08-05 NOTE — Telephone Encounter (Signed)
 Called and spoke with patient reviewed Dr. Armanda Heritage message. Patient was verbally frustrated. He called [facility] this morning and they advised him that there is a lot of sickness in their office right now and that it would be best not to come. Advised patient that Dr. Armanda Heritage recommendation is an in person evlauation. Offered to schedule patient at [facility], patient declined states he got sick from coming to the office last week. Advised patient he can take Tylenol to help with fever, patient states he is already taking tylenol, and fever is staying at 100-101. Again advised of Dr. Armanda Heritage recommendation, patient did not verbalize that he would seek care. Patient was verbally frustrated at advice given, offered to send Dr. Para March a message requesting OTC recommendations for his symptoms, patient declined and asked that I not send him anything else. Nothing further needed at this time. Sending to PCP as an [place].

## 2023-08-06 ENCOUNTER — Other Ambulatory Visit: Payer: Self-pay

## 2023-08-06 DIAGNOSIS — G8929 Other chronic pain: Secondary | ICD-10-CM

## 2023-08-06 NOTE — Telephone Encounter (Signed)
 Please send DME order for rollator.  Thanks. Dx M54.50.

## 2023-08-06 NOTE — Telephone Encounter (Signed)
Noted.  Thanks.  I will defer. 

## 2023-08-08 ENCOUNTER — Other Ambulatory Visit (INDEPENDENT_AMBULATORY_CARE_PROVIDER_SITE_OTHER): Payer: Medicare Other

## 2023-08-08 DIAGNOSIS — Z794 Long term (current) use of insulin: Secondary | ICD-10-CM | POA: Diagnosis not present

## 2023-08-08 DIAGNOSIS — E1165 Type 2 diabetes mellitus with hyperglycemia: Secondary | ICD-10-CM | POA: Diagnosis not present

## 2023-08-08 NOTE — Addendum Note (Signed)
 Addended by: Alvina Chou on: 08/08/2023 03:08 PM   Modules accepted: Orders

## 2023-08-09 LAB — BASIC METABOLIC PANEL
BUN: 16 mg/dL (ref 7–25)
CO2: 26 mmol/L (ref 20–32)
Calcium: 9.6 mg/dL (ref 8.6–10.3)
Chloride: 98 mmol/L (ref 98–110)
Creat: 0.89 mg/dL (ref 0.70–1.30)
Glucose, Bld: 116 mg/dL — ABNORMAL HIGH (ref 65–99)
Potassium: 5.1 mmol/L (ref 3.5–5.3)
Sodium: 136 mmol/L (ref 135–146)

## 2023-08-10 ENCOUNTER — Encounter: Payer: Self-pay | Admitting: Family Medicine

## 2023-08-19 ENCOUNTER — Encounter: Payer: Self-pay | Admitting: *Deleted

## 2023-08-24 ENCOUNTER — Encounter: Payer: Self-pay | Admitting: Family Medicine

## 2023-08-25 ENCOUNTER — Telehealth: Payer: Self-pay | Admitting: Family Medicine

## 2023-08-25 NOTE — Telephone Encounter (Signed)
 Sorry, I completely misread your message!   I will re-route to another clinic.

## 2023-08-25 NOTE — Telephone Encounter (Signed)
 Please use existing referral to endo to send patient to a clinic other than [facility].  Thanks.

## 2023-08-25 NOTE — Telephone Encounter (Signed)
 Referral has already been sent to [facility] endo on 08/19/2023  Patient mychart message read by Clint Bolder at 7:17PM on 08/19/2023.   Patient is aware

## 2023-08-25 NOTE — Telephone Encounter (Signed)
 He can't go back to [facility].  Please send it to another clinic.  Thanks.

## 2023-08-25 NOTE — Telephone Encounter (Signed)
 Thanks

## 2023-08-27 ENCOUNTER — Other Ambulatory Visit: Payer: Self-pay | Admitting: Family Medicine

## 2023-08-27 DIAGNOSIS — M961 Postlaminectomy syndrome, not elsewhere classified: Secondary | ICD-10-CM

## 2023-08-27 DIAGNOSIS — F1190 Opioid use, unspecified, uncomplicated: Secondary | ICD-10-CM

## 2023-08-27 DIAGNOSIS — G8929 Other chronic pain: Secondary | ICD-10-CM

## 2023-08-27 DIAGNOSIS — F419 Anxiety disorder, unspecified: Secondary | ICD-10-CM

## 2023-08-29 MED ORDER — OXYCODONE-ACETAMINOPHEN 10-325 MG PO TABS: 1.0000 | ORAL_TABLET | Freq: Three times a day (TID) | ORAL | 0 refills | Status: DC | PRN

## 2023-09-15 ENCOUNTER — Ambulatory Visit: Attending: Family Medicine

## 2023-09-15 DIAGNOSIS — R011 Cardiac murmur, unspecified: Secondary | ICD-10-CM

## 2023-09-15 LAB — ECHOCARDIOGRAM COMPLETE
AR max vel: 1.49 cm2
AV Area VTI: 1.57 cm2
AV Area mean vel: 1.51 cm2
AV Mean grad: 11 mmHg
AV Peak grad: 21.5 mmHg
Ao pk vel: 2.32 m/s
Area-P 1/2: 3.31 cm2
S' Lateral: 3.1 cm

## 2023-09-17 ENCOUNTER — Encounter: Payer: Self-pay | Admitting: Family Medicine

## 2023-11-25 ENCOUNTER — Telehealth: Admitting: Family Medicine

## 2023-11-25 ENCOUNTER — Ambulatory Visit: Admitting: Family Medicine

## 2023-11-26 ENCOUNTER — Other Ambulatory Visit: Payer: Self-pay | Admitting: Family Medicine

## 2023-11-26 DIAGNOSIS — F32A Depression, unspecified: Secondary | ICD-10-CM

## 2023-11-26 NOTE — Telephone Encounter (Signed)
 LOV:08/01/23 NOV: 12/22/23 LAST REFILL:  ALPRAZOLAM  0.5 MG TAB 08/29/23 90 tablets 2 refills OXYCODONE -APAP 10-325 MG TAB 08/29/23 90 tablets 0 refills

## 2023-11-27 NOTE — Telephone Encounter (Signed)
 Sent 1 month supply, has f/u OV pending. Thanks.

## 2023-12-22 ENCOUNTER — Ambulatory Visit: Payer: Self-pay | Admitting: Family Medicine

## 2023-12-22 ENCOUNTER — Other Ambulatory Visit: Payer: Self-pay

## 2023-12-22 ENCOUNTER — Ambulatory Visit (INDEPENDENT_AMBULATORY_CARE_PROVIDER_SITE_OTHER): Admitting: Family Medicine

## 2023-12-22 ENCOUNTER — Encounter: Payer: Self-pay | Admitting: Family Medicine

## 2023-12-22 VITALS — BP 154/82 | HR 85 | Temp 97.8°F | Ht 75.0 in | Wt 206.4 lb

## 2023-12-22 DIAGNOSIS — M961 Postlaminectomy syndrome, not elsewhere classified: Secondary | ICD-10-CM | POA: Diagnosis not present

## 2023-12-22 DIAGNOSIS — E1165 Type 2 diabetes mellitus with hyperglycemia: Secondary | ICD-10-CM

## 2023-12-22 DIAGNOSIS — M79643 Pain in unspecified hand: Secondary | ICD-10-CM

## 2023-12-22 DIAGNOSIS — M898X1 Other specified disorders of bone, shoulder: Secondary | ICD-10-CM

## 2023-12-22 DIAGNOSIS — M5442 Lumbago with sciatica, left side: Secondary | ICD-10-CM

## 2023-12-22 DIAGNOSIS — F419 Anxiety disorder, unspecified: Secondary | ICD-10-CM

## 2023-12-22 DIAGNOSIS — F1190 Opioid use, unspecified, uncomplicated: Secondary | ICD-10-CM

## 2023-12-22 DIAGNOSIS — G8929 Other chronic pain: Secondary | ICD-10-CM

## 2023-12-22 DIAGNOSIS — Z794 Long term (current) use of insulin: Secondary | ICD-10-CM | POA: Diagnosis not present

## 2023-12-22 DIAGNOSIS — F32A Depression, unspecified: Secondary | ICD-10-CM

## 2023-12-22 LAB — POCT GLYCOSYLATED HEMOGLOBIN (HGB A1C): Hemoglobin A1C: 10.5 % — AB (ref 4.0–5.6)

## 2023-12-22 MED ORDER — OXYCODONE-ACETAMINOPHEN 10-325 MG PO TABS: 1.0000 | ORAL_TABLET | Freq: Three times a day (TID) | ORAL | 0 refills | Status: AC | PRN

## 2023-12-22 MED ORDER — OXYCODONE-ACETAMINOPHEN 10-325 MG PO TABS: 1.0000 | ORAL_TABLET | Freq: Three times a day (TID) | ORAL | 0 refills | Status: DC | PRN

## 2023-12-22 MED ORDER — ACCU-CHEK GUIDE ME W/DEVICE KIT
1.0000 | PACK | Freq: Three times a day (TID) | 0 refills | Status: AC
Start: 2023-12-22 — End: ?

## 2023-12-22 MED ORDER — ALPRAZOLAM 0.5 MG PO TABS: 0.5000 mg | ORAL_TABLET | Freq: Three times a day (TID) | ORAL | 2 refills | Status: DC | PRN

## 2023-12-22 MED ORDER — ACCU-CHEK GUIDE TEST VI STRP: ORAL_STRIP | 12 refills | Status: AC

## 2023-12-22 NOTE — Telephone Encounter (Signed)
 Copied from CRM [id number]. Topic: General - Other >> Dec 22, 2023 12:44 PM Adrionna Y wrote: Reason for CRM: [facility]   Is calling because they need a prescription sent over for the accu-chek lancets so he has the full system

## 2023-12-22 NOTE — Patient Instructions (Addendum)
 Go to the lab on the way out.   If you have mychart we'll likely use that to update you.    Take care.  Glad to see you. Xray at [facility] at [facility] [address] [place],  [place]  [address] Main: [phone]

## 2023-12-22 NOTE — Progress Notes (Unsigned)
 Diabetes:  Using medications without difficulties: yes Hypoglycemic episodes: occ down to 60s.  Cautions d/w pt.   Hyperglycemic episodes:no Feet problems: no tingling or numbness.   Blood Sugars averaging:variable, see above, 100s o/w.  Variable with diet.  eye exam within last year: d/w pt about eval when possible.   A1c down to 10.5, that is not controlled but better.  Down from 72.  D/w pt about diet.  He has endo f/u pending.  Fructosamine pending.  Chronic pain.  Still in pain at baseline, worse recently after a fall.  Limited exercise recently.Still on oxycodone  at baseline.  Not sedated.  Recent fall.  On Thursday night he fell and abraded the left side of his face and bruised his clavicle area.  He may have jammed his right second finger, with pain at R 2nd MCP. Episode was due to panic attack per patient report.  He was getting out of bed and fell.    Meds, vitals, and allergies reviewed.   ROS: Per HPI unless specifically indicated in ROS section   GEN: nad, alert and oriented HEENT: mucous membranes moist NECK: supple w/o LA CV: rrr. SEM noted.  PULM: ctab, no inc wob ABD: soft, +bs EXT: no edema SKIN: no acute rash L collarbone ttp medial and mid section.   Pain with R 2nd MCP ROM, locally puffy.  Normal grip otherwise.  30 minutes were devoted to patient care in this encounter (this includes time spent reviewing the patient's file/history, interviewing and examining the patient, counseling/reviewing plan with patient).

## 2023-12-23 ENCOUNTER — Other Ambulatory Visit: Payer: Self-pay

## 2023-12-23 ENCOUNTER — Ambulatory Visit
Admission: RE | Admit: 2023-12-23 | Discharge: 2023-12-23 | Disposition: A | Discharge status: Home / Self Care | Attending: Family Medicine | Admitting: Family Medicine

## 2023-12-23 ENCOUNTER — Ambulatory Visit
Admission: RE | Admit: 2023-12-23 | Discharge: 2023-12-23 | Disposition: A | Discharge status: Home / Self Care | Source: Ambulatory Visit | Attending: Family Medicine | Admitting: Family Medicine

## 2023-12-23 DIAGNOSIS — M898X1 Other specified disorders of bone, shoulder: Secondary | ICD-10-CM | POA: Diagnosis present

## 2023-12-23 DIAGNOSIS — M79643 Pain in unspecified hand: Secondary | ICD-10-CM | POA: Insufficient documentation

## 2023-12-23 MED ORDER — ACCU-CHEK SOFTCLIX LANCETS MISC: 12 refills | Status: AC

## 2023-12-23 NOTE — Telephone Encounter (Signed)
 Sent!

## 2023-12-24 DIAGNOSIS — M79643 Pain in unspecified hand: Secondary | ICD-10-CM | POA: Insufficient documentation

## 2023-12-24 DIAGNOSIS — M898X1 Other specified disorders of bone, shoulder: Secondary | ICD-10-CM | POA: Insufficient documentation

## 2023-12-24 NOTE — Assessment & Plan Note (Signed)
 Given fall history, check plain films.

## 2023-12-24 NOTE — Assessment & Plan Note (Signed)
 Not sedated, fall cautions discussed with patient.  No increase in opiates at this point.

## 2023-12-24 NOTE — Assessment & Plan Note (Signed)
 A1c down to 10.5, that is not controlled but better.  Down from 66.  D/w pt about diet.  He has endo f/u pending.  Fructosamine pending.  See notes on labs. I did not escalate his treatment given his recent relative hypoglycemia.

## 2023-12-25 LAB — FRUCTOSAMINE: Fructosamine: 403 umol/L — ABNORMAL HIGH (ref 205–285)

## 2024-01-26 ENCOUNTER — Other Ambulatory Visit: Payer: Self-pay | Admitting: Family Medicine

## 2024-01-26 DIAGNOSIS — G4700 Insomnia, unspecified: Secondary | ICD-10-CM

## 2024-02-16 ENCOUNTER — Ambulatory Visit: Admitting: "Endocrinology

## 2024-02-16 ENCOUNTER — Encounter: Payer: Self-pay | Admitting: "Endocrinology

## 2024-02-16 VITALS — BP 130/84 | HR 84 | Ht 75.0 in | Wt 208.0 lb

## 2024-02-16 DIAGNOSIS — Z794 Long term (current) use of insulin: Secondary | ICD-10-CM

## 2024-02-16 DIAGNOSIS — E1165 Type 2 diabetes mellitus with hyperglycemia: Secondary | ICD-10-CM | POA: Diagnosis not present

## 2024-02-16 DIAGNOSIS — Z7984 Long term (current) use of oral hypoglycemic drugs: Secondary | ICD-10-CM | POA: Diagnosis not present

## 2024-02-16 DIAGNOSIS — E7800 Pure hypercholesterolemia, unspecified: Secondary | ICD-10-CM

## 2024-02-16 NOTE — Patient Instructions (Signed)

## 2024-02-16 NOTE — Progress Notes (Signed)
 Outpatient Endocrinology Note Obadiah Birmingham, MD  02/16/24   Kevin Huff 02-28-1966 [id number]  Referring Provider: Cleatus Arlyss RAMAN, MD Primary Care Provider: Cleatus Arlyss RAMAN, MD Reason for consultation: Subjective   Assessment & Plan  Diagnoses and all orders for this visit:  Uncontrolled type 2 diabetes mellitus with hyperglycemia (HCC) -     Ambulatory referral to diabetic education  Long-term insulin  use (HCC)  Long term (current) use of oral hypoglycemic drugs  Pure hypercholesterolemia    Diabetes Type II complicated by hyperglycemia,  Lab Results  Component Value Date   GFR 96.58 08/01/2023   Hba1c goal less than 7, current Hba1c is  Lab Results  Component Value Date   HGBA1C 10.5 (A) 12/22/2023   Will recommend the following: Metformin  XR 500 mg 2 pills bid Glipizide  10 mg every day  Novolin  70/30 12-14 units 15 min before break fast and dinner   A1C 8.5% based off of fructosamine Patient is against CGM Given log to check BG three times a day 15 min before meals; patient understand we cannot adjust insulin  without BG log  Ordered DM education  No known contraindications/side effects to any of above medications  -Last LD and Tg are as follows: Lab Results  Component Value Date   LDLCALC 95 08/01/2023    Lab Results  Component Value Date   TRIG 106.0 08/01/2023   -not on statin due to legs hurting -Follow low fat diet and exercise   -Blood pressure goal <140/90 - Microalbumin/creatinine goal is < 30 -Last MA/Cr is as follows: Lab Results  Component Value Date   MICROALBUR 29.3 (H) 08/01/2023   -on ACE/ARB lisinopril  20 mg every day  -diet changes including salt restriction -limit eating outside -counseled BP targets per standards of diabetes care -uncontrolled blood pressure can lead to retinopathy, nephropathy and cardiovascular and atherosclerotic heart disease  Reviewed and counseled on: -A1C target -Blood sugar  targets -Complications of uncontrolled diabetes  -Checking blood sugar before meals and bedtime and bring log next visit -All medications with mechanism of action and side effects -Hypoglycemia management: rule of 15's, Glucagon Emergency Kit and medical alert ID -low-carb low-fat plate-method diet -At least 20 minutes of physical activity per day -Annual dilated retinal eye exam and foot exam -compliance and follow up needs -follow up as scheduled or earlier if problem gets worse  Call if blood sugar is less than 70 or consistently above 250    Take a 15 gm snack of carbohydrate at bedtime before you go to sleep if your blood sugar is less than 100.    If you are going to fast after midnight for a test or procedure, ask your physician for instructions on how to reduce/decrease your insulin  dose.    Call if blood sugar is less than 70 or consistently above 250  -Treating a low sugar by rule of 15  (15 gms of sugar every 15 min until sugar is more than 70) If you feel your sugar is low, test your sugar to be sure If your sugar is low (less than 70), then take 15 grams of a fast acting Carbohydrate (3-4 glucose tablets or glucose gel or 4 ounces of juice or regular soda) Recheck your sugar 15 min after treating low to make sure it is more than 70 If sugar is still less than 70, treat again with 15 grams of carbohydrate          Don't drive the hour of  hypoglycemia  If unconscious/unable to eat or drink by mouth, use glucagon injection or nasal spray baqsimi and call 911. Can repeat again in 15 min if still unconscious.  Return in about 3 months (around 05/17/2024).   I have reviewed current medications, nurse's notes, allergies, vital signs, past medical and surgical history, family medical history, and social history for this encounter. Counseled patient on symptoms, examination findings, lab findings, imaging results, treatment decisions and monitoring and prognosis. The patient  understood the recommendations and agrees with the treatment plan. All questions regarding treatment plan were fully answered.  Obadiah Birmingham, MD  02/16/24   History of Present Illness Kevin Huff is a 58 y.o. year old male who presents for evaluation of Type II diabetes mellitus.  Kevin Huff was first diagnosed in 2006.   Diabetes education +  Home diabetes regimen: Metformin  XR 500 gm 2 pills bid Glipizide  10 mg every day  Novolin  70/30 12-14 units 0-2 times a day   COMPLICATIONS -  MI/Stroke -  retinopathy -  neuropathy -  nephropathy  SYMPTOMS REVIEWED - Polyuria - Weight loss - Blurred vision  BLOOD SUGAR DATA Checks BG every 2-3 days  Did not bring meter  70-115 per recall  Physical Exam  BP 130/84   Pulse 84   Ht 6' 3 (1.905 m)   Wt 208 lb (94.3 kg)   SpO2 96%   BMI 26.00 kg/m    Constitutional: well developed, well nourished Head: normocephalic, atraumatic Eyes: sclera anicteric, no redness Neck: supple Lungs: normal respiratory effort Neurology: alert and oriented Skin: dry, no appreciable rashes Musculoskeletal: no appreciable defects Psychiatric: normal mood and affect Diabetic Foot Exam - Simple   No data filed      Current Medications Patient's Medications  New Prescriptions   No medications on file  Previous Medications   ACCU-CHEK SOFTCLIX LANCETS LANCETS    Use as instructed   ALPRAZOLAM  (XANAX ) 0.5 MG TABLET    Take 1 tablet (0.5 mg total) by mouth 3 (three) times daily as needed for anxiety.   BLOOD GLUCOSE MONITORING SUPPL (ACCU-CHEK GUIDE ME) W/DEVICE KIT    1 each by Other route 3 (three) times daily.   CHOLECALCIFEROL (VITAMIN D3) 50 MCG (2000 UT) CHEW    Chew by mouth.   CYANOCOBALAMIN  (VITAMIN B-12 PO)    Take 1,000 mg by mouth daily.   FEXOFENADINE HCL (ALLEGRA PO)    Take by mouth daily.   FLUTICASONE  (FLONASE ) 50 MCG/ACT NASAL SPRAY    Place 2 sprays into both nostrils daily.   GLIPIZIDE  (GLUCOTROL ) 10 MG TABLET     TAKE 1 TABLET BY MOUTH TWICE DAILY BEFORE A MEAL   GLUCOSE BLOOD (ACCU-CHEK GUIDE TEST) TEST STRIP    Test TID daily   INSULIN  NPH-REGULAR HUMAN (NOVOLIN  70/30) (70-30) 100 UNIT/ML INJECTION    Inject 16 Units into the skin 2 (two) times daily with a meal.   LISINOPRIL  (ZESTRIL ) 20 MG TABLET    TAKE 1 TABLET(20 MG) BY MOUTH DAILY   METFORMIN  (GLUCOPHAGE -XR) 500 MG 24 HR TABLET    TAKE 2 TABLETS BY MOUTH TWICE A DAY   NAPROXEN  (NAPROSYN ) 500 MG TABLET    TAKE 1 TABLET BY MOUTH TWICE A DAY AS NEEDED TAKE WITH FOOD.   OXYCODONE -ACETAMINOPHEN  (PERCOCET) 10-325 MG TABLET    Take 1 tablet by mouth every 8 (eight) hours as needed. for pain   OXYCODONE -ACETAMINOPHEN  (PERCOCET) 10-325 MG TABLET    Take 1 tablet by  mouth every 8 (eight) hours as needed. for pain   OXYCODONE -ACETAMINOPHEN  (PERCOCET) 10-325 MG TABLET    Take 1 tablet by mouth every 8 (eight) hours as needed for pain.   TRAZODONE  (DESYREL ) 100 MG TABLET    TAKE 2 TABLETS BY MOUTH AT BEDTIME AS NEEDED FOR SLEEP  Modified Medications   No medications on file  Discontinued Medications   No medications on file    Allergies Allergies  Allergen Reactions   Duloxetine  Diarrhea and Nausea Only   Codeine    Sertraline  Hcl Diarrhea and Nausea Only   Penicillins Rash    Past Medical History Past Medical History:  Diagnosis Date   Chronic back pain greater than 3 months duration    Depression    Diabetes mellitus without complication (HCC)    Hypertension     Past Surgical History Past Surgical History:  Procedure Laterality Date   BACK SURGERY     KNEE SURGERY      Family History family history includes Cancer in his mother; Diabetes in his father; Heart disease in his father; Hyperlipidemia in his father; Hypertension in his father.  Social History Social History   Socioeconomic History   Marital status: Married    Spouse name: Not on file   Number of children: Not on file   Years of education: Not on file   Highest  education level: Not on file  Occupational History   Not on file  Tobacco Use   Smoking status: Never   Smokeless tobacco: Never  Vaping Use   Vaping status: Never Used  Substance and Sexual Activity   Alcohol use: No   Drug use: Never   Sexual activity: Yes    Partners: Female  Other Topics Concern   Not on file  Social History Narrative   From [place]    Married 1983   Social Drivers of Health   Financial Resource Strain: Low Risk  (01/29/2023)   Overall Financial Resource Strain (CARDIA)    Difficulty of Paying Living Expenses: Not hard at all  Food Insecurity: No Food Insecurity (01/29/2023)   Hunger Vital Sign    Worried About Running Out of Food in the Last Year: Never true    Ran Out of Food in the Last Year: Never true  Transportation Needs: No Transportation Needs (01/29/2023)   PRAPARE - Administrator, Civil Service (Medical): No    Lack of Transportation (Non-Medical): No  Physical Activity: Insufficiently Active (01/29/2023)   Exercise Vital Sign    Days of Exercise per Week: 3 days    Minutes of Exercise per Session: 10 min  Stress: No Stress Concern Present (01/29/2023)   [employer] of Occupational Health - Occupational Stress Questionnaire    Feeling of Stress : Not at all  Social Connections: Unknown (01/29/2023)   Social Connection and Isolation Panel    Frequency of Communication with Friends and Family: More than three times a week    Frequency of Social Gatherings with Friends and Family: More than three times a week    Attends Religious Services: Patient declined    Database administrator or Organizations: No    Attends Banker Meetings: Never    Marital Status: Married  Catering manager Violence: Not At Risk (01/29/2023)   Humiliation, Afraid, Rape, and Kick questionnaire    Fear of Current or Ex-Partner: No    Emotionally Abused: No    Physically Abused: No    Sexually  Abused: No    Lab Results  Component  Value Date   HGBA1C 10.5 (A) 12/22/2023   HGBA1C 8.7 09/12/2022   HGBA1C 11.8 (A) 03/17/2020   Lab Results  Component Value Date   CHOL 157 08/01/2023   Lab Results  Component Value Date   HDL 40.80 08/01/2023   Lab Results  Component Value Date   LDLCALC 95 08/01/2023   Lab Results  Component Value Date   TRIG 106.0 08/01/2023   Lab Results  Component Value Date   CHOLHDL 4 08/01/2023   Lab Results  Component Value Date   CREATININE 0.89 08/08/2023   Lab Results  Component Value Date   GFR 96.58 08/01/2023   Lab Results  Component Value Date   MICROALBUR 29.3 (H) 08/01/2023      Component Value Date/Time   NA 136 08/08/2023 1508   NA 133 (L) 10/15/2012 0826   K 5.1 08/08/2023 1508   K 4.0 10/15/2012 0826   CL 98 08/08/2023 1508   CL 97 (L) 10/15/2012 0826   CO2 26 08/08/2023 1508   CO2 26 10/15/2012 0826   GLUCOSE 116 (H) 08/08/2023 1508   GLUCOSE 388 (H) 10/15/2012 0826   BUN 16 08/08/2023 1508   BUN 14 10/15/2012 0826   CREATININE 0.89 08/08/2023 1508   CALCIUM  9.6 08/08/2023 1508   CALCIUM  9.5 10/15/2012 0826   PROT 6.5 08/01/2023 0851   PROT 8.1 08/11/2011 0818   ALBUMIN 3.8 08/01/2023 0851   ALBUMIN 4.2 08/11/2011 0818   AST 13 08/01/2023 0851   AST 16 08/11/2011 0818   ALT 16 08/01/2023 0851   ALT 23 08/11/2011 0818   ALKPHOS 81 08/01/2023 0851   ALKPHOS 96 08/11/2011 0818   BILITOT 1.3 (H) 08/01/2023 0851   BILITOT 1.1 (H) 08/11/2011 0818   GFRNONAA >60 07/22/2015 1245   GFRNONAA >60 10/15/2012 0826   GFRAA >60 07/22/2015 1245   GFRAA >60 10/15/2012 0826      Latest Ref Rng & Units 08/08/2023    3:08 PM 08/01/2023    8:51 AM 03/17/2020   11:12 AM  BMP  Glucose 65 - 99 mg/dL 883  605  650   BUN 7 - 25 mg/dL 16  12  17    Creatinine 0.70 - 1.30 mg/dL 9.10  9.19  9.01   BUN/Creat Ratio 6 - 22 (calc) SEE NOTE:     Sodium 135 - 146 mmol/L 136  129  135   Potassium 3.5 - 5.3 mmol/L 5.1  5.6 No hemolysis seen  5.9 No hemolysis seen    Chloride 98 - 110 mmol/L 98  93  97   CO2 20 - 32 mmol/L 26  27  27    Calcium  8.6 - 10.3 mg/dL 9.6  8.9  89.5        Component Value Date/Time   WBC 9.5 08/01/2023 0851   RBC 5.29 08/01/2023 0851   HGB 15.2 08/01/2023 0851   HGB 16.3 08/11/2011 0818   HCT 44.7 08/01/2023 0851   HCT 47.5 08/11/2011 0818   PLT 305.0 08/01/2023 0851   PLT 248 08/11/2011 0818   MCV 84.5 08/01/2023 0851   MCV 86 08/11/2011 0818   MCH 28.9 07/22/2015 1245   MCHC 34.0 08/01/2023 0851   RDW 14.1 08/01/2023 0851   RDW 12.3 08/11/2011 0818   LYMPHSABS 1.2 08/01/2023 0851   MONOABS 0.3 08/01/2023 0851   EOSABS 0.0 08/01/2023 0851   BASOSABS 0.0 08/01/2023 0851     Parts  of this note may have been dictated using voice recognition software. There may be variances in spelling and vocabulary which are unintentional. Not all errors are proofread. Please notify the dino if any discrepancies are noted or if the meaning of any statement is not clear.

## 2024-03-22 ENCOUNTER — Other Ambulatory Visit: Payer: Self-pay | Admitting: Family Medicine

## 2024-03-22 NOTE — Telephone Encounter (Signed)
 LOV 12/22/23   NOV nothing scheduled  Last refill:  Xanax  12/22/23 90 tab w/ 2 refill  Oxycodone  12/22/23 90 tab w/ 0 refills

## 2024-03-23 NOTE — Telephone Encounter (Signed)
 Please schedule f/u in the office in the next month. Thanks.

## 2024-03-24 NOTE — Telephone Encounter (Signed)
 Please call patient to schedule office visit in the next month and route back to the [employer]. Thank you

## 2024-03-31 NOTE — Telephone Encounter (Signed)
 Patient returned call and has been scheduled for 04/29/24 at 8:30 am

## 2024-03-31 NOTE — Telephone Encounter (Signed)
 Lvm to call office

## 2024-04-07 ENCOUNTER — Encounter: Attending: "Endocrinology | Admitting: Dietician

## 2024-04-07 ENCOUNTER — Encounter: Payer: Self-pay | Admitting: Dietician

## 2024-04-07 DIAGNOSIS — E1165 Type 2 diabetes mellitus with hyperglycemia: Secondary | ICD-10-CM | POA: Diagnosis present

## 2024-04-07 DIAGNOSIS — Z713 Dietary counseling and surveillance: Secondary | ICD-10-CM | POA: Diagnosis not present

## 2024-04-07 DIAGNOSIS — Z794 Long term (current) use of insulin: Secondary | ICD-10-CM | POA: Diagnosis not present

## 2024-04-07 NOTE — Telephone Encounter (Signed)
 Noted

## 2024-04-07 NOTE — Progress Notes (Signed)
 Diabetes Self-Management Education  Visit Type: First/Initial  Appt. Start Time: 0845 Appt. End Time: 0940  04/07/2024  [name], identified by name and date of birth, is a 58 y.o. male with a diagnosis of Diabetes: Type 2.   ASSESSMENT Pt reports taking Glipizide  @10mg  daily, Novolin  70/30 14u once daily w/ meal, Metformin  @1000  BID for their DM. No hypoglycemia reported. Pt reports long history of DM, has started taking management more seriously over the last 1-2 years. Pt reports weight loss of ~100 lbs over the last 2 years, states they have been watching what they eat, smaller portions. Pt reports only eating once a day, may be lunch or dinner, meal is usually a lean meat with a vegetable. Pt reports degenerative bone disease that is painful, states this makes activity difficult, previously tried PT but did not see any relief. Pt reports they will walk when they can and stops if it becomes painful.    Diabetes Self-Management Education - 04/07/24 0852       Visit Information   Visit Type First/Initial      Initial Visit   Diabetes Type Type 2    Date Diagnosed 2006    Are you currently following a meal plan? No    Are you taking your medications as prescribed? Yes      Health Coping   How would you rate your overall health? Fair      Psychosocial Assessment   Patient Belief/Attitude about Diabetes Motivated to manage diabetes    What is the hardest part about your diabetes right now, causing you the most concern, or is the most worrisome to you about your diabetes?   Getting support / problem solving    Self-care barriers None    Self-management support None    Other persons present Patient    Patient Concerns Glycemic Control    Special Needs None    Preferred Learning Style Auditory    Learning Readiness Not Ready    How often do you need to have someone help you when you read instructions, pamphlets, or other written materials from your doctor or  pharmacy? 1 - Never    What is the last grade level you completed in school? 12th grade      Pre-Education Assessment   Patient understands the diabetes disease and treatment process. Needs Instruction    Patient understands incorporating nutritional management into lifestyle. Needs Instruction    Patient undertands incorporating physical activity into lifestyle. Needs Instruction    Patient understands using medications safely. Needs Instruction    Patient understands monitoring blood glucose, interpreting and using results Needs Instruction    Patient understands prevention, detection, and treatment of acute complications. Needs Instruction    Patient understands prevention, detection, and treatment of chronic complications. Needs Instruction    Patient understands how to develop strategies to address psychosocial issues. Needs Instruction    Patient understands how to develop strategies to promote health/change behavior. Needs Instruction      Complications   Last HgB A1C per patient/outside source 8.5 %   Fructosamine - 403, 02/16/2024   How often do you check your blood sugar? 1-2 times/day    Fasting Blood glucose range (mg/dL) 29-870    Have you had a dilated eye exam in the past 12 months? Yes    Have you had a dental exam in the past 12 months? No    Are you checking your feet? No  Dietary Intake   Dinner BBQ pork, Green beans    Beverage(s) water      Activity / Exercise   Activity / Exercise Type ADL's;Light (walking / raking leaves)      Patient Education   Previous Diabetes Education Yes    Disease Pathophysiology Explored patient's options for treatment of their diabetes    Healthy Eating Role of diet in the treatment of diabetes and the relationship between the three main macronutrients and blood glucose level;Plate Method;Meal options for control of blood glucose level and chronic complications.    Being Active Other (comment)   Unable to exercise due to pain    Medications Reviewed patients medication for diabetes, action, purpose, timing of dose and side effects.;Taught/reviewed insulin /injectables, injection, site rotation, insulin /injectables storage and needle disposal.    Monitoring Identified appropriate SMBG and/or A1C goals.    Acute complications Taught prevention, symptoms, and  treatment of hypoglycemia - the 15 rule.;Discussed and identified patients' prevention, symptoms, and treatment of hyperglycemia.    Chronic complications Relationship between chronic complications and blood glucose control    Diabetes Stress and Support Role of stress on diabetes;Identified and addressed patients feelings and concerns about diabetes    Lifestyle and Health Coping Lifestyle issues that need to be addressed for better diabetes care      Individualized Goals (developed by patient)   Nutrition Follow meal plan discussed    Physical Activity Not Applicable    Medications take my medication as prescribed    Monitoring  Test my blood glucose as discussed    Problem Solving Eating Pattern    Reducing Risk examine blood glucose patterns    Health Coping Ask for help with psychological, social, or emotional issues      Post-Education Assessment   Patient understands the diabetes disease and treatment process. Comprehends key points    Patient understands incorporating nutritional management into lifestyle. Comprehends key points    Patient undertands incorporating physical activity into lifestyle. N/A    Patient understands using medications safely. Comphrehends key points    Patient understands monitoring blood glucose, interpreting and using results Comprehends key points    Patient understands prevention, detection, and treatment of acute complications. Comprehends key points    Patient understands prevention, detection, and treatment of chronic complications. Needs Review    Patient understands how to develop strategies to address psychosocial issues.  Needs Review    Patient understands how to develop strategies to promote health/change behavior. Comprehends key points      Outcomes   Expected Outcomes Demonstrated interest in learning. Expect positive outcomes    Future DMSE PRN    Program Status Completed          Individualized Plan for Diabetes Self-Management Training:   Learning Objective:  Patient will have a greater understanding of diabetes self-management. Patient education plan is to attend individual and/or group sessions per assessed needs and concerns.   Plan:   Patient Instructions  Add in a balanced snack earlier in the day to help have another small meal!  Begin to recognize carbohydrates, proteins, and non-starchy vegetables in your food choices!  Begin to build your meals using the proportions of the Balanced Plate. First, select your carb choice(s) for the meal. Make this 25% of your meal. Next, select your source of protein to pair with your carb choice(s). Make this another 25% of your meal. Finally, complete your meal with a variety of non-starchy vegetables. Make this the remaining 50% of your  meal.  Check your blood sugar each morning before eating or drinking (fasting). Look for numbers between 70-100 mg/dL  Your goal J8r is below 7.0%    Expected Outcomes:  Demonstrated interest in learning. Expect positive outcomes  Education material provided: My Plate and Snack sheet  If problems or questions, patient to contact team via:  Phone and Email  Future DSME appointment: PRN

## 2024-04-07 NOTE — Patient Instructions (Addendum)
 Add in a balanced snack earlier in the day to help have another small meal!  Begin to recognize carbohydrates, proteins, and non-starchy vegetables in your food choices!  Begin to build your meals using the proportions of the Balanced Plate. First, select your carb choice(s) for the meal. Make this 25% of your meal. Next, select your source of protein to pair with your carb choice(s). Make this another 25% of your meal. Finally, complete your meal with a variety of non-starchy vegetables. Make this the remaining 50% of your meal.  Check your blood sugar each morning before eating or drinking (fasting). Look for numbers between 70-100 mg/dL  Your goal J8r is below 7.0%

## 2024-04-22 ENCOUNTER — Other Ambulatory Visit: Payer: Self-pay | Admitting: Family

## 2024-04-22 ENCOUNTER — Other Ambulatory Visit: Payer: Self-pay | Admitting: Family Medicine

## 2024-04-22 ENCOUNTER — Encounter: Payer: Self-pay | Admitting: Family

## 2024-04-22 DIAGNOSIS — G4700 Insomnia, unspecified: Secondary | ICD-10-CM

## 2024-04-22 DIAGNOSIS — R198 Other specified symptoms and signs involving the digestive system and abdomen: Secondary | ICD-10-CM

## 2024-04-26 ENCOUNTER — Other Ambulatory Visit: Payer: Self-pay | Admitting: Family Medicine

## 2024-04-27 ENCOUNTER — Telehealth: Payer: Self-pay

## 2024-04-27 NOTE — Telephone Encounter (Signed)
 Pended the Alprazolam  prescription for Dr. Elfredia review.

## 2024-04-27 NOTE — Telephone Encounter (Signed)
 Error

## 2024-04-27 NOTE — Telephone Encounter (Signed)
 Copied from CRM [id number]. Topic: Clinical - Medication Question >> Apr 27, 2024  8:45 AM Eva FALCON wrote: Reason for CRM: Pt was calling in to check on status of Alprazolam . Did advise it could take up to 3 business days, which he understood. Just wanted to call again since he took his last pill yesterday.

## 2024-04-27 NOTE — Telephone Encounter (Unsigned)
 Copied from CRM [id number]. Topic: Clinical - Medication Question >> Apr 27, 2024  8:45 AM Eva FALCON wrote: Reason for CRM: Pt was calling in to check on status of Alprazolam . Did advise it could take up to 3 business days, which he understood. Just wanted to call again since he took his last pill yesterday.

## 2024-04-28 ENCOUNTER — Telehealth: Payer: Self-pay | Admitting: Family Medicine

## 2024-04-28 ENCOUNTER — Other Ambulatory Visit: Payer: Self-pay | Admitting: Family Medicine

## 2024-04-28 MED ORDER — ALPRAZOLAM 0.5 MG PO TABS: 0.5000 mg | ORAL_TABLET | Freq: Three times a day (TID) | ORAL | 0 refills | Status: DC | PRN

## 2024-04-28 NOTE — Telephone Encounter (Signed)
 Noted. I'll defer to patient.

## 2024-04-28 NOTE — Telephone Encounter (Signed)
 Copied from CRM #[id number]. Topic: Clinical - Refused Triage >> Apr 28, 2024  9:07 AM Kevin Huff wrote: Patient/caller voiced complaints of fever and feeling sick with flu like symptoms(wouldn't elaborate with specific symptoms). Declined transfer to triage when offer; just requested to cancel appointment for tomorrow which was for a medication refill

## 2024-04-29 ENCOUNTER — Ambulatory Visit: Admitting: Family Medicine

## 2024-04-30 ENCOUNTER — Ambulatory Visit
Admission: RE | Admit: 2024-04-30 | Discharge: 2024-04-30 | Disposition: A | Discharge status: Home / Self Care | Source: Ambulatory Visit | Attending: Family | Admitting: Family

## 2024-04-30 DIAGNOSIS — R198 Other specified symptoms and signs involving the digestive system and abdomen: Secondary | ICD-10-CM | POA: Diagnosis present

## 2024-05-19 ENCOUNTER — Ambulatory Visit (INDEPENDENT_AMBULATORY_CARE_PROVIDER_SITE_OTHER): Admitting: *Deleted

## 2024-05-19 VITALS — Wt 208.0 lb

## 2024-05-19 DIAGNOSIS — Z0000 Encounter for general adult medical examination without abnormal findings: Secondary | ICD-10-CM

## 2024-05-19 NOTE — Patient Instructions (Signed)
 [name],  Thank you for taking the time for your Medicare Wellness Visit. I appreciate your continued commitment to your health goals. Please review the care plan we discussed, and feel free to reach out if I can assist you further.  Please note that Annual Wellness Visits do not include a physical exam. Some assessments may be limited, especially if the visit was conducted virtually. If needed, we may recommend an in-person follow-up with your provider.  Ongoing Care Seeing your primary care provider every 3 to 6 months helps us  monitor your health and provide consistent, personalized care.   Referrals If a referral was made during today's visit and you haven't received any updates within two weeks, please contact the referred provider directly to check on the status.  Recommended Screenings:  Health Maintenance  Topic Date Due   DTaP/Tdap/Td vaccine (1 - Tdap) Never done   Zoster (Shingles) Vaccine (1 of 2) Never done   Pneumococcal Vaccine for age over 68 (2 of 2 - PCV) 11/09/2018   Eye exam for diabetics  09/12/2023   Flu Shot  01/09/2024   Hemoglobin A1C  06/23/2024   Yearly kidney health urinalysis for diabetes  07/31/2024   Complete foot exam   07/31/2024   Yearly kidney function blood test for diabetes  08/07/2024   Medicare Annual Wellness Visit  05/19/2025   Cologuard (Stool DNA test)  11/17/2025   Hepatitis C Screening  Completed   HIV Screening  Completed   Hepatitis B Vaccine  Aged Out   HPV Vaccine  Aged Out   Meningitis B Vaccine  Aged Out   COVID-19 Vaccine  Discontinued       05/19/2024   11:34 AM  Advanced Directives  Does Patient Have a Medical Advance Directive? No  Would patient like information on creating a medical advance directive? No - Patient declined    Vision: Annual vision screenings are recommended for early detection of glaucoma, cataracts, and diabetic retinopathy. These exams can also reveal signs of chronic conditions such as diabetes  and high blood pressure.  Dental: Annual dental screenings help detect early signs of oral cancer, gum disease, and other conditions linked to overall health, including heart disease and diabetes.  Please see the attached documents for additional preventive care recommendations.    [name] , Thank you for taking time to come for your Medicare Wellness Visit. I appreciate your ongoing commitment to your health goals. Please review the following plan we discussed and let me know if I can assist you in the future.   Screening recommendations/referrals: Colonoscopy:  Mammogram:  Bone Density:  Recommended yearly ophthalmology/optometry visit for glaucoma screening and checkup Recommended yearly dental visit for hygiene and checkup  Vaccinations: Influenza vaccine:  Pneumococcal vaccine:  Tdap vaccine:  Shingles vaccine:      Preventive Care 65 Years and Older, Male Preventive care refers to lifestyle choices and visits with your health care provider that can promote health and wellness. What does preventive care include? A yearly physical exam. This is also called an annual well check. Dental exams once or twice a year. Routine eye exams. Ask your health care provider how often you should have your eyes checked. Personal lifestyle choices, including: Daily care of your teeth and gums. Regular physical activity. Eating a healthy diet. Avoiding tobacco and drug use. Limiting alcohol use. Practicing safe sex. Taking low-dose aspirin every day. Taking vitamin and mineral supplements as recommended by your health care provider. What happens during an  annual well check? The services and screenings done by your health care provider during your annual well check will depend on your age, overall health, lifestyle risk factors, and family history of disease. Counseling  Your health care provider may ask you questions about your: Alcohol use. Tobacco use. Drug use. Emotional  well-being. Home and relationship well-being. Sexual activity. Eating habits. History of falls. Memory and ability to understand (cognition). Work and work astronomer. Reproductive health. Screening  You may have the following tests or measurements: Height, weight, and BMI. Blood pressure. Lipid and cholesterol levels. These may be checked every 5 years, or more frequently if you are over 64 years old. Skin check. Lung cancer screening. You may have this screening every year starting at age 24 if you have a 30-pack-year history of smoking and currently smoke or have quit within the past 15 years. Fecal occult blood test (FOBT) of the stool. You may have this test every year starting at age 69. Flexible sigmoidoscopy or colonoscopy. You may have a sigmoidoscopy every 5 years or a colonoscopy every 10 years starting at age 50. Hepatitis C blood test. Hepatitis B blood test. Sexually transmitted disease (STD) testing. Diabetes screening. This is done by checking your blood sugar (glucose) after you have not eaten for a while (fasting). You may have this done every 1-3 years. Bone density scan. This is done to screen for osteoporosis. You may have this done starting at age 40. Mammogram. This may be done every 1-2 years. Talk to your health care provider about how often you should have regular mammograms. Talk with your health care provider about your test results, treatment options, and if necessary, the need for more tests. Vaccines  Your health care provider may recommend certain vaccines, such as: Influenza vaccine. This is recommended every year. Tetanus, diphtheria, and acellular pertussis (Tdap, Td) vaccine. You may need a Td booster every 10 years. Zoster vaccine. You may need this after age 22. Pneumococcal 13-valent conjugate (PCV13) vaccine. One dose is recommended after age 82. Pneumococcal polysaccharide (PPSV23) vaccine. One dose is recommended after age 31. Talk to your  health care provider about which screenings and vaccines you need and how often you need them. This information is not intended to replace advice given to you by your health care provider. Make sure you discuss any questions you have with your health care provider. Document Released: 06/23/2015 Document Revised: 02/14/2016 Document Reviewed: 03/28/2015 Elsevier Interactive Patient Education  2017 [employer].  Fall Prevention in the Home Falls can cause injuries. They can happen to people of all ages. There are many things you can do to make your home safe and to help prevent falls. What can I do on the outside of my home? Regularly fix the edges of walkways and driveways and fix any cracks. Remove anything that might make you trip as you walk through a door, such as a raised step or threshold. Trim any bushes or trees on the path to your home. Use bright outdoor lighting. Clear any walking paths of anything that might make someone trip, such as rocks or tools. Regularly check to see if handrails are loose or broken. Make sure that both sides of any steps have handrails. Any raised decks and porches should have guardrails on the edges. Have any leaves, snow, or ice cleared regularly. Use sand or salt on walking paths during winter. Clean up any spills in your garage right away. This includes oil or grease spills. What can I  do in the bathroom? Use night lights. Install grab bars by the toilet and in the tub and shower. Do not use towel bars as grab bars. Use non-skid mats or decals in the tub or shower. If you need to sit down in the shower, use a plastic, non-slip stool. Keep the floor dry. Clean up any water that spills on the floor as soon as it happens. Remove soap buildup in the tub or shower regularly. Attach bath mats securely with double-sided non-slip rug tape. Do not have throw rugs and other things on the floor that can make you trip. What can I do in the bedroom? Use night  lights. Make sure that you have a light by your bed that is easy to reach. Do not use any sheets or blankets that are too big for your bed. They should not hang down onto the floor. Have a firm chair that has side arms. You can use this for support while you get dressed. Do not have throw rugs and other things on the floor that can make you trip. What can I do in the kitchen? Clean up any spills right away. Avoid walking on wet floors. Keep items that you use a lot in easy-to-reach places. If you need to reach something above you, use a strong step stool that has a grab bar. Keep electrical cords out of the way. Do not use floor polish or wax that makes floors slippery. If you must use wax, use non-skid floor wax. Do not have throw rugs and other things on the floor that can make you trip. What can I do with my stairs? Do not leave any items on the stairs. Make sure that there are handrails on both sides of the stairs and use them. Fix handrails that are broken or loose. Make sure that handrails are as long as the stairways. Check any carpeting to make sure that it is firmly attached to the stairs. Fix any carpet that is loose or worn. Avoid having throw rugs at the top or bottom of the stairs. If you do have throw rugs, attach them to the floor with carpet tape. Make sure that you have a light switch at the top of the stairs and the bottom of the stairs. If you do not have them, ask someone to add them for you. What else can I do to help prevent falls? Wear shoes that: Do not have high heels. Have rubber bottoms. Are comfortable and fit you well. Are closed at the toe. Do not wear sandals. If you use a stepladder: Make sure that it is fully opened. Do not climb a closed stepladder. Make sure that both sides of the stepladder are locked into place. Ask someone to hold it for you, if possible. Clearly mark and make sure that you can see: Any grab bars or handrails. First and last  steps. Where the edge of each step is. Use tools that help you move around (mobility aids) if they are needed. These include: Canes. Walkers. Scooters. Crutches. Turn on the lights when you go into a dark area. Replace any light bulbs as soon as they burn out. Set up your furniture so you have a clear path. Avoid moving your furniture around. If any of your floors are uneven, fix them. If there are any pets around you, be aware of where they are. Review your medicines with your doctor. Some medicines can make you feel dizzy. This can increase your chance of falling. Ask  your doctor what other things that you can do to help prevent falls. This information is not intended to replace advice given to you by your health care provider. Make sure you discuss any questions you have with your health care provider. Document Released: 03/23/2009 Document Revised: 11/02/2015 Document Reviewed: 07/01/2014 Elsevier Interactive Patient Education  2017 [employer].

## 2024-05-19 NOTE — Progress Notes (Signed)
 Chief Complaint  Patient presents with   Medicare Wellness     Subjective:   Kevin Huff is a 58 y.o. male who presents for a Medicare Annual Wellness Visit.  No voiced or noted concerns at this time Vaccines not given: not office   Education provided declined today   Visit info / Clinical Intake: Medicare Wellness Visit Type:: Subsequent Annual Wellness Visit Persons participating in visit and providing information:: patient Medicare Wellness Visit Mode:: Telephone If telephone:: video declined Since this visit was completed virtually, some vitals may be partially provided or unavailable. Missing vitals are due to the limitations of the virtual format.: Unable to obtain vitals - no equipment If Telephone or Video please confirm:: I connected with patient using audio/video enable telemedicine. I verified patient identity with two identifiers, discussed telehealth limitations, and patient agreed to proceed. Patient Location:: home Provider Location:: home Interpreter Needed?: No Pre-visit prep was completed: no AWV questionnaire completed by patient prior to visit?: no Living arrangements:: lives with spouse/significant other Patient's Overall Health Status Rating: (!) fair Typical amount of pain: (!) a lot Does pain affect daily life?: (!) yes Are you currently prescribed opioids?: (!) yes  Dietary Habits and Nutritional Risks How many meals a day?: 2 Eats fruit and vegetables daily?: yes Most meals are obtained by: preparing own meals; having others provide food Diabetic:: (!) yes Any non-healing wounds?: no How often do you check your BS?: 1 Would you like to be referred to a Nutritionist or for Diabetic Management? : no  Functional Status Activities of Daily Living (to include ambulation/medication): (!) Needs Assist Feeding: Independent Dressing/Grooming: Independent with device - see below Bathing: Independent with device- listed below Toileting:  Independent Transfer: Independent with device- listed below Ambulation: Independent with device- listed below Home Assistive Devices/Equipment: Johna Finder (specify Type) Medication Administration: Needs assistance (comment) Home Management (perform basic housework or laundry): Dependent Manage your own finances?: (!) no Primary transportation is: family / friends Concerns about vision?: no *vision screening is required for WTM* Concerns about hearing?: no  Fall Screening Falls in the past year?: 1 Number of falls in past year: 1 Was there an injury with Fall?: 1 Fall Risk Category Calculator: 3 Patient Fall Risk Level: High Fall Risk  Fall Risk Patient at Risk for Falls Due to: Impaired mobility Fall risk Follow up: Falls evaluation completed; Education provided; Falls prevention discussed  Home and Transportation Safety: All rugs have non-skid backing?: yes All stairs or steps have railings?: yes Grab bars in the bathtub or shower?: yes Have non-skid surface in bathtub or shower?: yes Good home lighting?: yes Regular seat belt use?: yes Hospital stays in the last year:: no  Cognitive Assessment Difficulty concentrating, remembering, or making decisions? : yes Will 6CIT or Mini Cog be Completed: yes What year is it?: 0 points What month is it?: 0 points Give patient an address phrase to remember (5 components): Its very sunny outside today in December About what time is it?: 0 points Count backwards from 20 to 1: 0 points Say the months of the year in reverse: 0 points Repeat the address phrase from earlier: 0 points 6 CIT Score: 0 points  Advance Directives (For Healthcare) Does Patient Have a Medical Advance Directive?: No Does patient want to make changes to medical advance directive?: No - Patient declined Would patient like information on creating a medical advance directive?: No - Patient declined  Reviewed/Updated  Reviewed/Updated: Surgical History; Reviewed  All (Medical, Surgical, Family,  Medications, Allergies, Care Teams, Patient Goals); Family History; Medications; Allergies; Care Teams; Patient Goals    Allergies (verified) Duloxetine , Codeine, Sertraline  hcl, and Penicillins   Current Medications (verified) Outpatient Encounter Medications as of 05/19/2024  Medication Sig   Accu-Chek Softclix Lancets lancets Use as instructed   ALPRAZolam  (XANAX ) 0.5 MG tablet Take 1 tablet (0.5 mg total) by mouth 3 (three) times daily as needed for anxiety. PLEASE CONTACT [facility] FOR AN APPOINTMENT.  CALL [phone] OR USE MYCHART.   Blood Glucose Monitoring Suppl (ACCU-CHEK GUIDE ME) w/Device KIT 1 each by Other route 3 (three) times daily.   Cholecalciferol (VITAMIN D3) 50 MCG (2000 UT) CHEW Chew by mouth.   Cyanocobalamin  (VITAMIN B-12 PO) Take 1,000 mg by mouth daily.   Fexofenadine HCl (ALLEGRA PO) Take by mouth daily.   fluticasone  (FLONASE ) 50 MCG/ACT nasal spray Place 2 sprays into both nostrils daily.   glipiZIDE  (GLUCOTROL ) 10 MG tablet TAKE 1 TABLET BY MOUTH TWICE DAILY BEFORE A MEAL   glucose blood (ACCU-CHEK GUIDE TEST) test strip Test TID daily   insulin  NPH-regular Human (NOVOLIN  70/30) (70-30) 100 UNIT/ML injection Inject 16 Units into the skin 2 (two) times daily with a meal.   lisinopril  (ZESTRIL ) 20 MG tablet TAKE 1 TABLET(20 MG) BY MOUTH DAILY   metFORMIN  (GLUCOPHAGE -XR) 500 MG 24 hr tablet TAKE 2 TABLETS BY MOUTH TWICE A DAY   naproxen  (NAPROSYN ) 500 MG tablet TAKE 1 TABLET BY MOUTH TWICE A DAY AS NEEDED TAKE WITH FOOD.   oxyCODONE -acetaminophen  (PERCOCET) 10-325 MG tablet Take 1 tablet by mouth every 8 (eight) hours as needed. for pain   oxyCODONE -acetaminophen  (PERCOCET) 10-325 MG tablet Take 1 tablet by mouth every 8 (eight) hours as needed. for pain   oxyCODONE -acetaminophen  (PERCOCET) 10-325 MG tablet TAKE 1 TABLET BY MOUTH EVERY 8 HOURS AS NEEDED FOR PAIN   oxyCODONE -acetaminophen  (PERCOCET) 10-325 MG tablet TAKE  1 TABLET BY MOUTH EVERY 8 HOURS AS NEEDED FOR PAIN   traZODone  (DESYREL ) 100 MG tablet TAKE 2 TABLETS BY MOUTH AT BEDTIME AS NEEDED FOR SLEEP   No facility-administered encounter medications on file as of 05/19/2024.    History: Past Medical History:  Diagnosis Date   Chronic back pain greater than 3 months duration    Depression    Diabetes mellitus without complication (HCC)    Hypertension    Past Surgical History:  Procedure Laterality Date   BACK SURGERY     KNEE SURGERY     Family History  Problem Relation Age of Onset   Cancer Mother    Hyperlipidemia Father    Hypertension Father    Diabetes Father    Heart disease Father    Colon cancer Neg Hx    Social History   Occupational History   Not on file  Tobacco Use   Smoking status: Never   Smokeless tobacco: Never  Vaping Use   Vaping status: Never Used  Substance and Sexual Activity   Alcohol use: No   Drug use: Never   Sexual activity: Yes    Partners: Female   Tobacco Counseling Counseling given: Not Answered  SDOH Screenings   Food Insecurity: No Food Insecurity (05/19/2024)  Housing: Unknown (05/19/2024)  Transportation Needs: No Transportation Needs (05/19/2024)  Utilities: Not At Risk (05/19/2024)  Alcohol Screen: Low Risk  (01/29/2023)  Depression (PHQ2-9): Low Risk  (05/19/2024)  Financial Resource Strain: Low Risk  (01/29/2023)  Physical Activity: Insufficiently Active (05/19/2024)  Social Connections: Moderately Isolated (05/19/2024)  Stress: No Stress Concern Present (05/19/2024)  Tobacco Use: Low Risk  (05/19/2024)  Health Literacy: Adequate Health Literacy (05/19/2024)   See flowsheets for full screening details  Depression Screen PHQ 2 & 9 Depression Scale- Over the past 2 weeks, how often have you been bothered by any of the following problems? Little interest or pleasure in doing things: 0 Feeling down, depressed, or hopeless (PHQ Adolescent also includes...irritable): 0 PHQ-2  Total Score: 0 Trouble falling or staying asleep, or sleeping too much: 0 Feeling tired or having little energy: 0 Poor appetite or overeating (PHQ Adolescent also includes...weight loss): 0 Feeling bad about yourself - or that you are a failure or have let yourself or your family down: 1 Trouble concentrating on things, such as reading the newspaper or watching television (PHQ Adolescent also includes...like school work): 0 Moving or speaking so slowly that other people could have noticed. Or the opposite - being so fidgety or restless that you have been moving around a lot more than usual: 0 Thoughts that you would be better off dead, or of hurting yourself in some way: 0 PHQ-9 Total Score: 1 If you checked off any problems, how difficult have these problems made it for you to do your work, take care of things at home, or get along with other people?: Not difficult at all     Goals Addressed             This Visit's Progress    Patient Stated   On track    09/09/2019, I will maintain and continue medications as prescribed.      Patient Stated   On track    11/20/2020, I will maintain and continue medications as prescribed.      Patient Stated   On track    12/28/2021, keeping blood sugar down     Patient Stated   Not on track    Get off diabetic medication     Patient Stated       Maintain current medication             Objective:    Today's Vitals   05/19/24 1144  Weight: 208 lb (94.3 kg)   Body mass index is 26 kg/m.  Hearing/Vision screen Hearing Screening - Comments:: No trouble hearing Vision Screening - Comments:: Unsure of name Immunizations and Health Maintenance Health Maintenance  Topic Date Due   DTaP/Tdap/Td (1 - Tdap) Never done   Zoster Vaccines- Shingrix (1 of 2) Never done   Pneumococcal Vaccine: 50+ Years (2 of 2 - PCV) 11/09/2018   OPHTHALMOLOGY EXAM  09/12/2023   Influenza Vaccine  01/09/2024   HEMOGLOBIN A1C  06/23/2024   Diabetic kidney  evaluation - Urine ACR  07/31/2024   FOOT EXAM  07/31/2024   Diabetic kidney evaluation - eGFR measurement  08/07/2024   Medicare Annual Wellness (AWV)  05/19/2025   Fecal DNA (Cologuard)  11/17/2025   Hepatitis C Screening  Completed   HIV Screening  Completed   Hepatitis B Vaccines 19-59 Average Risk  Aged Out   HPV VACCINES  Aged Out   Meningococcal B Vaccine  Aged Out   COVID-19 Vaccine  Discontinued        Assessment/Plan:  This is a routine wellness examination for Kevin Huff.  Patient Care Team: Cleatus Arlyss RAMAN, MD as PCP - General (Family Medicine) Cherilyn Debby CROME, MD as Consulting Physician (Internal Medicine) Georgia Alm CROME, MD as Referring Physician (Ophthalmology)  I have personally reviewed and noted  the following in the patients chart:   Medical and social history Use of alcohol, tobacco or illicit drugs  Current medications and supplements including opioid prescriptions. Functional ability and status Nutritional status Physical activity Advanced directives List of other physicians Hospitalizations, surgeries, and ER visits in previous 12 months Vitals Screenings to include cognitive, depression, and falls Referrals and appointments  No orders of the defined types were placed in this encounter.  In addition, I have reviewed and discussed with patient certain preventive protocols, quality metrics, and best practice recommendations. A written personalized care plan for preventive services as well as general preventive health recommendations were provided to patient.   Mliss Graff, LPN   [id number]   Return in 1 year (on 05/19/2025).  After Visit Summary: (MyChart) Due to this being a telephonic visit, the after visit summary with patients personalized plan was offered to patient via MyChart   Nurse Notes:

## 2024-05-20 ENCOUNTER — Other Ambulatory Visit: Payer: Self-pay | Admitting: Family Medicine

## 2024-05-20 ENCOUNTER — Ambulatory Visit: Admitting: "Endocrinology

## 2024-05-20 ENCOUNTER — Ambulatory Visit
Admission: RE | Admit: 2024-05-20 | Discharge: 2024-05-20 | Disposition: A | Discharge status: Home / Self Care | Source: Ambulatory Visit | Attending: Family Medicine | Admitting: Family Medicine

## 2024-05-20 DIAGNOSIS — R198 Other specified symptoms and signs involving the digestive system and abdomen: Secondary | ICD-10-CM | POA: Diagnosis present

## 2024-05-20 DIAGNOSIS — R1903 Right lower quadrant abdominal swelling, mass and lump: Secondary | ICD-10-CM | POA: Insufficient documentation

## 2024-05-20 MED ORDER — IOHEXOL 300 MG/ML  SOLN
100.0000 mL | Freq: Once | INTRAMUSCULAR | Status: AC | PRN
  Administered 2024-05-20: 100 mL via INTRAVENOUS

## 2024-05-31 ENCOUNTER — Ambulatory Visit: Payer: Self-pay | Admitting: Surgery

## 2024-06-07 ENCOUNTER — Other Ambulatory Visit: Payer: Self-pay

## 2024-06-07 ENCOUNTER — Encounter (HOSPITAL_BASED_OUTPATIENT_CLINIC_OR_DEPARTMENT_OTHER): Payer: Self-pay | Admitting: Surgery

## 2024-06-08 ENCOUNTER — Encounter (HOSPITAL_BASED_OUTPATIENT_CLINIC_OR_DEPARTMENT_OTHER)
Admission: RE | Admit: 2024-06-08 | Discharge: 2024-06-08 | Disposition: A | Discharge status: Home / Self Care | Source: Ambulatory Visit | Attending: Surgery | Admitting: Surgery

## 2024-06-08 ENCOUNTER — Other Ambulatory Visit (HOSPITAL_BASED_OUTPATIENT_CLINIC_OR_DEPARTMENT_OTHER)

## 2024-06-08 DIAGNOSIS — Z794 Long term (current) use of insulin: Secondary | ICD-10-CM | POA: Insufficient documentation

## 2024-06-08 DIAGNOSIS — Z01812 Encounter for preprocedural laboratory examination: Secondary | ICD-10-CM | POA: Diagnosis present

## 2024-06-08 DIAGNOSIS — E1165 Type 2 diabetes mellitus with hyperglycemia: Secondary | ICD-10-CM | POA: Diagnosis not present

## 2024-06-08 LAB — BASIC METABOLIC PANEL WITH GFR
Anion gap: 10 (ref 5–15)
BUN: 15 mg/dL (ref 6–20)
CO2: 26 mmol/L (ref 22–32)
Calcium: 9.4 mg/dL (ref 8.9–10.3)
Chloride: 98 mmol/L (ref 98–111)
Creatinine, Ser: 0.8 mg/dL (ref 0.61–1.24)
GFR, Estimated: >60 mL/min
Glucose, Bld: 387 mg/dL — ABNORMAL HIGH (ref 70–99)
Potassium: 5.7 mmol/L — ABNORMAL HIGH (ref 3.5–5.1)
Sodium: 134 mmol/L — ABNORMAL LOW (ref 135–145)

## 2024-06-08 MED ORDER — CHLORHEXIDINE GLUCONATE CLOTH 2 % EX PADS: 6.0000 | MEDICATED_PAD | Freq: Once | CUTANEOUS | Status: AC

## 2024-06-08 NOTE — Progress Notes (Signed)
 Today's BMP results reviewed with Dr. Alphonza Needle, anesthesiologist. Re-check BMP morning of surgery per aforesaid. Pt notified of arrival time change.

## 2024-06-08 NOTE — Progress Notes (Signed)
      Enhanced Recovery after Surgery  Enhanced Recovery after Surgery is a protocol used to improve the stress on your body and your recovery after surgery.  Patient Instructions  The night before surgery:  No food after midnight. ONLY clear liquids after midnight  The day of surgery (if you do NOT have diabetes):  Drink ONE (1) Pre-Surgery Clear Ensure as directed.   This drink was given to you during your hospital  pre-op appointment visit. The pre-op nurse will instruct you on the time to drink the  Pre-Surgery Ensure depending on your surgery time. Finish the drink at the designated time by the pre-op nurse.  Nothing else to drink after completing the  Pre-Surgery Clear Ensure.  The day of surgery (if you have diabetes): Drink ONE (1) Gatorade 2 (G2) as directed. This drink was given to you during your hospital  pre-op appointment visit.  The pre-op nurse will instruct you on the time to drink the   Gatorade 2 (G2) depending on your surgery time. Color of the Gatorade may vary. Red is not allowed. Nothing else to drink after completing the  Gatorade 2 (G2).         If you have questions, please contact your surgeon's office.  Pre-surgical soap given as well.

## 2024-06-15 ENCOUNTER — Other Ambulatory Visit: Payer: Self-pay | Admitting: Family Medicine

## 2024-06-15 ENCOUNTER — Encounter (HOSPITAL_BASED_OUTPATIENT_CLINIC_OR_DEPARTMENT_OTHER): Admission: RE | Payer: Self-pay | Source: Home / Self Care

## 2024-06-15 ENCOUNTER — Ambulatory Visit (HOSPITAL_BASED_OUTPATIENT_CLINIC_OR_DEPARTMENT_OTHER): Admission: RE | Admit: 2024-06-15 | Source: Home / Self Care | Admitting: Surgery

## 2024-06-15 DIAGNOSIS — Z794 Long term (current) use of insulin: Secondary | ICD-10-CM

## 2024-06-15 DIAGNOSIS — Z01818 Encounter for other preprocedural examination: Secondary | ICD-10-CM

## 2024-06-15 SURGERY — REPAIR, HERNIA, INGUINAL, ADULT
Anesthesia: General | Laterality: Right

## 2024-06-18 NOTE — Telephone Encounter (Signed)
 Last OV: 12/22/23 Pending OV: 06/21/24 Medication: alprazolam  Babara) 0.5 mg Last Refill: 04/28/24 Qty: #90 with 0 refills  Last OV: 12/22/23  Pending OV: 06/21/24 Medication: OXYCODONE -ACETAMINOPHEN  (PERCOCET) 10-325 MG Last Refill: 04/23/24 Qty: #90 with 0 refills

## 2024-06-18 NOTE — Telephone Encounter (Signed)
 Sent.  Needs in office OV when possible.  Thanks.

## 2024-06-21 ENCOUNTER — Encounter: Payer: Self-pay | Admitting: Family Medicine

## 2024-06-21 ENCOUNTER — Ambulatory Visit: Admitting: Family Medicine

## 2024-06-21 VITALS — BP 142/82 | HR 86 | Temp 97.9°F | Ht 75.0 in | Wt 217.1 lb

## 2024-06-21 DIAGNOSIS — Z794 Long term (current) use of insulin: Secondary | ICD-10-CM

## 2024-06-21 DIAGNOSIS — E1165 Type 2 diabetes mellitus with hyperglycemia: Secondary | ICD-10-CM | POA: Diagnosis not present

## 2024-06-21 DIAGNOSIS — M545 Low back pain, unspecified: Secondary | ICD-10-CM

## 2024-06-21 DIAGNOSIS — G8929 Other chronic pain: Secondary | ICD-10-CM

## 2024-06-21 LAB — BASIC METABOLIC PANEL WITH GFR
BUN: 16 mg/dL (ref 6–23)
CO2: 26 meq/L (ref 19–32)
Calcium: 8.7 mg/dL (ref 8.4–10.5)
Chloride: 102 meq/L (ref 96–112)
Creatinine, Ser: 0.66 mg/dL (ref 0.40–1.50)
GFR: 101.72 mL/min
Glucose, Bld: 220 mg/dL — ABNORMAL HIGH (ref 70–99)
Potassium: 4.6 meq/L (ref 3.5–5.1)
Sodium: 134 meq/L — ABNORMAL LOW (ref 135–145)

## 2024-06-21 LAB — MICROALBUMIN / CREATININE URINE RATIO
Creatinine,U: 46.3 mg/dL
Microalb Creat Ratio: 1265 mg/g — ABNORMAL HIGH (ref 0.0–30.0)
Microalb, Ur: 58.5 mg/dL — ABNORMAL HIGH (ref 0.7–1.9)

## 2024-06-21 LAB — POCT GLYCOSYLATED HEMOGLOBIN (HGB A1C): Hemoglobin A1C: 11.1 % — AB (ref 4.0–5.6)

## 2024-06-21 MED ORDER — BUDESONIDE-FORMOTEROL FUMARATE 160-4.5 MCG/ACT IN AERO: 2.0000 | INHALATION_SPRAY | Freq: Two times a day (BID) | RESPIRATORY_TRACT | 3 refills | Status: AC

## 2024-06-21 NOTE — Progress Notes (Unsigned)
 Diabetes:  Using medications without difficulties: yes Hypoglycemic episodes: no Hyperglycemic episodes: no Blood Sugars averaging: 100-120s usually. No 200s, no 300s usually.   No tingling in the feet.   eye exam within last year: due, d/w pt.   A1c 11. D/w pt at OV.  Fructosamine pending.  MALB pending.   Hernia surgery was cancelled.  D/w pt about A1c, with prev discrepancy from A1c vs fructosamine.  I can update surgery when I have his results.   More R inguinal area pain with standing, more swelling with being upright, improves supine.    Chronic pain.  Prev CT with  Musculoskeletal: No suspicious bone lesions identified. Prior fusion again seen at L5-S1. Severe L4-5 degenerative disc disease with grade 1 anterolisthesis noted. Neurostimulator device seen with leads in the thoracic spinal canal.  More pain in back with weather changes.  Still on oxycodone  at baseline. Still using cane at baseline.   D/w pt about cough after flu.  Had used symbicort  with relief.  D/w pt about rinsing after use.  Had used med after illness, not o/w.    PMH and SH reviewed  Meds, vitals, and allergies reviewed.   ROS: Per HPI unless specifically indicated in ROS section   GEN: nad, alert and oriented HEENT: mucous membranes moist NECK: supple w/o LA CV: rrr. SEM noted.  PULM: ctab, no inc wob ABD: soft, +bs EXT: no edema SKIN: no acute rash  30 minutes were devoted to patient care in this encounter (this includes time spent reviewing the patient's file/history, interviewing and examining the patient, counseling/reviewing plan with patient).

## 2024-06-21 NOTE — Patient Instructions (Signed)
 Go to the lab on the way out.   If you have mychart we'll likely use that to update you.    Take care.  Glad to see you.

## 2024-06-23 NOTE — Assessment & Plan Note (Addendum)
 A1c 11. D/w pt at OV.  Fructosamine pending.  MALB pending.  See notes on labs. Discussed that he could have significant A1c versus fructosamine discrepancy.  Continue glipizide  insulin  and metformin  for now.  Hernia surgery was cancelled.  D/w pt about A1c, with prev discrepancy from A1c vs fructosamine.  I can update surgery when I have his results.

## 2024-06-23 NOTE — Assessment & Plan Note (Signed)
 Prev CT with  Musculoskeletal: No suspicious bone lesions identified. Prior fusion again seen at L5-S1. Severe L4-5 degenerative disc disease with grade 1 anterolisthesis noted. Neurostimulator device seen with leads in the thoracic spinal canal.  More pain in back with weather changes.  Still on oxycodone  at baseline. Still using cane at baseline.   Continue oxycodone  at baseline.

## 2024-06-25 LAB — FRUCTOSAMINE: Fructosamine: 330 umol/L — ABNORMAL HIGH (ref 205–285)

## 2024-06-27 ENCOUNTER — Ambulatory Visit: Payer: Self-pay | Admitting: Family Medicine

## 2024-06-27 MED ORDER — EMPAGLIFLOZIN 10 MG PO TABS: 10.0000 mg | ORAL_TABLET | Freq: Every day | ORAL | 3 refills | Status: AC

## 2024-06-28 NOTE — Telephone Encounter (Signed)
 Message and results have been faxed to Dr. Vanderbilt as requested.

## 2024-07-05 ENCOUNTER — Other Ambulatory Visit: Payer: Self-pay | Admitting: Family Medicine

## 2024-07-05 DIAGNOSIS — E1165 Type 2 diabetes mellitus with hyperglycemia: Secondary | ICD-10-CM

## 2024-07-06 NOTE — Telephone Encounter (Signed)
 Pt needs 6 mth DM OV in July Thank you!!

## 2024-07-07 ENCOUNTER — Encounter (HOSPITAL_BASED_OUTPATIENT_CLINIC_OR_DEPARTMENT_OTHER): Payer: Self-pay | Admitting: Surgery

## 2024-07-07 ENCOUNTER — Other Ambulatory Visit: Payer: Self-pay

## 2024-07-14 ENCOUNTER — Ambulatory Visit (HOSPITAL_BASED_OUTPATIENT_CLINIC_OR_DEPARTMENT_OTHER): Admitting: Certified Registered"

## 2024-07-14 ENCOUNTER — Ambulatory Visit (HOSPITAL_BASED_OUTPATIENT_CLINIC_OR_DEPARTMENT_OTHER): Admission: RE | Admit: 2024-07-14 | Admitting: Surgery

## 2024-07-14 ENCOUNTER — Encounter (HOSPITAL_BASED_OUTPATIENT_CLINIC_OR_DEPARTMENT_OTHER): Payer: Self-pay | Admitting: Surgery

## 2024-07-14 ENCOUNTER — Encounter (HOSPITAL_BASED_OUTPATIENT_CLINIC_OR_DEPARTMENT_OTHER)
Admission: RE | Disposition: A | Discharge status: Home / Self Care | Payer: Self-pay | Source: Home / Self Care | Attending: Surgery

## 2024-07-14 DIAGNOSIS — E1165 Type 2 diabetes mellitus with hyperglycemia: Secondary | ICD-10-CM

## 2024-07-14 HISTORY — DX: Allergy, unspecified, initial encounter: T78.40XA

## 2024-07-14 LAB — GLUCOSE, CAPILLARY
Glucose-Capillary: 104 mg/dL — ABNORMAL HIGH (ref 70–99)
Glucose-Capillary: 149 mg/dL — ABNORMAL HIGH (ref 70–99)

## 2024-07-14 MED ORDER — DEXAMETHASONE SOD PHOSPHATE PF 10 MG/ML IJ SOLN
INTRAMUSCULAR | Status: AC
  Filled 2024-07-14: qty 1

## 2024-07-14 MED ORDER — MIDAZOLAM HCL (PF) 2 MG/2ML IJ SOLN
2.0000 mg | Freq: Once | INTRAMUSCULAR | Status: AC
  Administered 2024-07-14: 2 mg via INTRAVENOUS

## 2024-07-14 MED ORDER — ONDANSETRON HCL 4 MG/2ML IJ SOLN
INTRAMUSCULAR | Status: DC | PRN
  Administered 2024-07-14: 4 mg via INTRAVENOUS

## 2024-07-14 MED ORDER — FENTANYL CITRATE (PF) 100 MCG/2ML IJ SOLN
INTRAMUSCULAR | Status: DC | PRN
  Administered 2024-07-14 (×2): 50 ug via INTRAVENOUS

## 2024-07-14 MED ORDER — PROPOFOL 10 MG/ML IV BOLUS
INTRAVENOUS | Status: AC
  Filled 2024-07-14: qty 20

## 2024-07-14 MED ORDER — ROCURONIUM BROMIDE 10 MG/ML (PF) SYRINGE
PREFILLED_SYRINGE | INTRAVENOUS | Status: DC | PRN
  Administered 2024-07-14: 50 mg via INTRAVENOUS

## 2024-07-14 MED ORDER — KETOROLAC TROMETHAMINE 30 MG/ML IJ SOLN
INTRAMUSCULAR | Status: DC | PRN
  Administered 2024-07-14: 30 mg via INTRAVENOUS

## 2024-07-14 MED ORDER — DEXAMETHASONE SOD PHOSPHATE PF 10 MG/ML IJ SOLN
INTRAMUSCULAR | Status: DC | PRN
  Administered 2024-07-14: 5 mg via INTRAVENOUS

## 2024-07-14 MED ORDER — 0.9 % SODIUM CHLORIDE (POUR BTL) OPTIME
TOPICAL | Status: DC | PRN
  Administered 2024-07-14: 1000 mL

## 2024-07-14 MED ORDER — SUGAMMADEX SODIUM 500 MG/5ML IV SOLN
INTRAVENOUS | Status: DC | PRN
  Administered 2024-07-14: 200 mg via INTRAVENOUS

## 2024-07-14 MED ORDER — ESMOLOL HCL 100 MG/10ML IV SOLN
INTRAVENOUS | Status: AC
  Filled 2024-07-14: qty 20

## 2024-07-14 MED ORDER — CLINDAMYCIN PHOSPHATE 900 MG/50ML IV SOLN
900.0000 mg | INTRAVENOUS | Status: AC
  Administered 2024-07-14: 900 mg via INTRAVENOUS

## 2024-07-14 MED ORDER — ESMOLOL HCL 100 MG/10ML IV SOLN
INTRAVENOUS | Status: DC | PRN
  Administered 2024-07-14: 30 mg via INTRAVENOUS

## 2024-07-14 MED ORDER — ROCURONIUM BROMIDE 10 MG/ML (PF) SYRINGE
PREFILLED_SYRINGE | INTRAVENOUS | Status: AC
  Filled 2024-07-14: qty 10

## 2024-07-14 MED ORDER — EPHEDRINE SULFATE (PRESSORS) 25 MG/5ML IV SOSY
PREFILLED_SYRINGE | INTRAVENOUS | Status: DC | PRN
  Administered 2024-07-14 (×2): 10 mg via INTRAVENOUS
  Administered 2024-07-14: 5 mg via INTRAVENOUS

## 2024-07-14 MED ORDER — CLINDAMYCIN PHOSPHATE 900 MG/50ML IV SOLN
INTRAVENOUS | Status: AC
  Filled 2024-07-14: qty 50

## 2024-07-14 MED ORDER — FENTANYL CITRATE (PF) 100 MCG/2ML IJ SOLN
INTRAMUSCULAR | Status: AC
  Filled 2024-07-14: qty 2

## 2024-07-14 MED ORDER — KETOROLAC TROMETHAMINE 30 MG/ML IJ SOLN
INTRAMUSCULAR | Status: AC
  Filled 2024-07-14: qty 1

## 2024-07-14 MED ORDER — METHOCARBAMOL 500 MG PO TABS: 500.0000 mg | ORAL_TABLET | Freq: Three times a day (TID) | ORAL | 0 refills | Status: AC | PRN

## 2024-07-14 MED ORDER — BUPIVACAINE-EPINEPHRINE 0.25% -1:200000 IJ SOLN
INTRAMUSCULAR | Status: DC | PRN
  Administered 2024-07-14: 13 mL

## 2024-07-14 MED ORDER — FENTANYL CITRATE (PF) 100 MCG/2ML IJ SOLN
100.0000 ug | Freq: Once | INTRAMUSCULAR | Status: AC
  Administered 2024-07-14: 100 ug via INTRAVENOUS

## 2024-07-14 MED ORDER — MIDAZOLAM HCL 2 MG/2ML IJ SOLN
INTRAMUSCULAR | Status: AC
  Filled 2024-07-14: qty 2

## 2024-07-14 MED ORDER — LIDOCAINE 2% (20 MG/ML) 5 ML SYRINGE
INTRAMUSCULAR | Status: AC
  Filled 2024-07-14: qty 5

## 2024-07-14 MED ORDER — MEPERIDINE HCL 25 MG/ML IJ SOLN: 6.2500 mg | INTRAMUSCULAR | Status: DC | PRN

## 2024-07-14 MED ORDER — ONDANSETRON HCL 4 MG/2ML IJ SOLN
INTRAMUSCULAR | Status: AC
  Filled 2024-07-14: qty 2

## 2024-07-14 MED ORDER — HYDROMORPHONE HCL 1 MG/ML IJ SOLN
0.2500 mg | INTRAMUSCULAR | Status: DC | PRN
  Administered 2024-07-14 (×2): 0.5 mg via INTRAVENOUS

## 2024-07-14 MED ORDER — OXYCODONE HCL 5 MG PO TABS: 5.0000 mg | ORAL_TABLET | Freq: Four times a day (QID) | ORAL | 0 refills | Status: AC | PRN

## 2024-07-14 MED ORDER — ROPIVACAINE HCL 5 MG/ML IJ SOLN
INTRAMUSCULAR | Status: DC | PRN
  Administered 2024-07-14: 30 mL via PERINEURAL

## 2024-07-14 MED ORDER — PHENYLEPHRINE HCL (PRESSORS) 10 MG/ML IV SOLN
INTRAVENOUS | Status: DC | PRN
  Administered 2024-07-14 (×2): 80 ug via INTRAVENOUS

## 2024-07-14 MED ORDER — LACTATED RINGERS IV SOLN: INTRAVENOUS | Status: DC

## 2024-07-14 MED ORDER — HYDROMORPHONE HCL 1 MG/ML IJ SOLN
INTRAMUSCULAR | Status: AC
  Filled 2024-07-14: qty 0.5

## 2024-07-14 MED ORDER — PROPOFOL 10 MG/ML IV BOLUS
INTRAVENOUS | Status: DC | PRN
  Administered 2024-07-14: 130 mg via INTRAVENOUS

## 2024-07-14 MED ORDER — AMISULPRIDE (ANTIEMETIC) 5 MG/2ML IV SOLN: 10.0000 mg | Freq: Once | INTRAVENOUS | Status: DC | PRN

## 2024-07-14 MED ORDER — IBUPROFEN 800 MG PO TABS: 800.0000 mg | ORAL_TABLET | Freq: Three times a day (TID) | ORAL | 0 refills | Status: AC | PRN

## 2024-07-14 NOTE — Interval H&P Note (Signed)
 History and Physical Interval Note:  07/14/2024 11:52 AM  Kevin Huff  has presented today for surgery, with the diagnosis of RIGHT INGUINAL HERNIA.  The various methods of treatment have been discussed with the patient and family. After consideration of risks, benefits and other options for treatment, the patient has consented to  Procedures with comments: REPAIR, HERNIA, INGUINAL, ADULT (Right) - RIGHT INGUINAL HERNIA REPAIR WITH MESH as a surgical intervention.  The patient's history has been reviewed, patient examined, no change in status, stable for surgery.  I have reviewed the patient's chart and labs.  Questions were answered to the patient's satisfaction.   The risk of hernia repair include bleeding,  Infection,   Recurrence of the hernia,  Mesh use, chronic pain,  Organ injury,  Bowel injury,  Bladder injury,   nerve injury with numbness around the incision,  Death,  and worsening of preexisting  medical problems.  The alternatives to surgery have been discussed as well..  Long term expectations of both operative and non operative treatments have been discussed.   The patient agrees to proceed.   [name] A Lujuana Kapler

## 2024-07-14 NOTE — Op Note (Signed)
 Right inguinal hernia repair with mesh, Open, Procedure Note  Indications: The patient presented with a history of a right, reducible inguinal hernia.  The patient had progressive pain in his right groin with a bulge.  Evaluation revealed reducible right inguinal hernia.  He was seen in the office and opted for repair after reviewing all of his options of repair with mesh, laparoscopic approaches, open approaches and observation.The risk of hernia repair include bleeding,  Infection,   Recurrence of the hernia,  Mesh use, chronic pain,  Organ injury,  Bowel injury,  Bladder injury,   nerve injury with numbness around the incision,  Death,  and worsening of preexisting  medical problems.  The alternatives to surgery have been discussed as well..  Long term expectations of both operative and non operative treatments have been discussed.   The patient agrees to proceed.   Pre-operative Diagnosis: right reducible inguinal hernia initial without obstruction or gangrene  Post-operative Diagnosis: same  Surgeon: Debby DELENA Shipper MD  Assistants: None  Anesthesia: General endotracheal anesthesia and tap block and 0.25% Marcaine  with epinephrine   ASA Class: 2  Procedure Details  The patient was seen again in the Holding Room. The risks, benefits, complications, treatment options, and expected outcomes were discussed with the patient. The possibilities of reaction to medication, pulmonary aspiration, perforation of viscus, bleeding, recurrent infection, the need for additional procedures, and development of a complication requiring transfusion or further operation were discussed with the patient and/or family. There was concurrence with the proposed plan, and informed consent was obtained. The site of surgery was properly noted/marked. The patient was taken to the Operating Room, identified as Kevin Huff, and the procedure verified as hernia repair. A Time Out was held and the above information  confirmed.  The patient was placed in the supine position and underwent induction of anesthesia, the lower abdomen and groin was prepped and draped in the standard fashion, and 0.25% Marcaine  with epinephrine  was used to anesthetize the skin over the mid-portion of the inguinal canal. A transverse incision was made. Dissection was carried through the soft tissue to expose the inguinal canal and inguinal ligament along its lower edge. The external oblique fascia was split along the course of its fibers, exposing the inguinal canal. The cord and nerve were looped using a Penrose drain and reflected out of the field.  The patient had an indirect sac noted.  This was dissected off the cord structures carefully without injuring the cord structures and reduced back into the preperitoneal space.  The patient also had a sizable direct defect through the floor of the mouth.   Ovitex 1S 10 X 16 CM mesh was used.  It was cut into a keyhole configuration.  It was secured to the shelving edge of the inguinal ligament, pubic tubercle and conjoined tendon with interrupted #1 Novafil suture.  The cord was placed in the slit of the mesh.  The tails were secured around the cord with interrupted #1 Novafil suture.  There is ample room for the cord to exit the mass without constriction.  The ilioinguinal nerve was divided as exit the muscle due to entrapment of the mesh.  A single stitch was used to narrow the internal ring as well of 0 Vicryl.  T The contents were then returned to canal and the external oblique fashion was then closed in a continuous fashion using 3-0 Vicryl suture taking care not to cause entrapment. Scarpa's layer closed with 3 0 vicryl and 4  0 monocryl used to close the skin.  Dermabond used for dressing.  Instrument, sponge, and needle counts were correct prior to closure and at the conclusion of the case.  Findings: Hernia as above  Estimated Blood Loss: Minimal         Drains: None         Total IV  Fluids: Per or record         Specimens: None               Complications: None; patient tolerated the procedure well.         Disposition: PACU - hemodynamically stable.         Condition: stable
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
## 2024-07-14 NOTE — Anesthesia Preprocedure Evaluation (Addendum)
"                                    Anesthesia Evaluation  Patient identified by MRN, date of birth, ID band Patient awake    Reviewed: Allergy & Precautions, H&P , NPO status , Patient's Chart, lab work & pertinent test results  Airway Mallampati: II  TM Distance: >3 FB Neck ROM: Full    Dental  (+) Dental Advisory Given   Pulmonary neg pulmonary ROS   Pulmonary exam normal breath sounds clear to auscultation       Cardiovascular hypertension, Pt. on medications negative cardio ROS Normal cardiovascular exam Rhythm:Regular Rate:Normal     Neuro/Psych   Anxiety Depression    negative neurological ROS  negative psych ROS   GI/Hepatic negative GI ROS, Neg liver ROS,,,  Endo/Other  negative endocrine ROSdiabetes, Type 2    Renal/GU negative Renal ROS  negative genitourinary   Musculoskeletal negative musculoskeletal ROS (+)    Abdominal   Peds negative pediatric ROS (+)  Hematology negative hematology ROS (+)   Anesthesia Other Findings   Reproductive/Obstetrics negative OB ROS                              Anesthesia Physical Anesthesia Plan  ASA: 3  Anesthesia Plan: General   Post-op Pain Management: Regional block*   Induction: Intravenous  PONV Risk Score and Plan: 2 and Ondansetron , Midazolam  and Treatment may vary due to age or medical condition  Airway Management Planned: Oral ETT  Additional Equipment:   Intra-op Plan:   Post-operative Plan: Extubation in OR  Informed Consent: I have reviewed the patients History and Physical, chart, labs and discussed the procedure including the risks, benefits and alternatives for the proposed anesthesia with the patient or authorized representative who has indicated his/her understanding and acceptance.     Dental advisory given  Plan Discussed with: CRNA  Anesthesia Plan Comments:         Anesthesia Quick Evaluation  "

## 2024-07-14 NOTE — Anesthesia Postprocedure Evaluation (Signed)
"   Anesthesia Post Note  Patient: Kevin Huff  Procedure(s) Performed: REPAIR, HERNIA, INGUINAL, ADULT (Right: Groin)     Patient location during evaluation: PACU Anesthesia Type: General Level of consciousness: awake and alert Pain management: pain level controlled Vital Signs Assessment: post-procedure vital signs reviewed and stable Respiratory status: spontaneous breathing, nonlabored ventilation and respiratory function stable Cardiovascular status: blood pressure returned to baseline and stable Postop Assessment: no apparent nausea or vomiting Anesthetic complications: no   No notable events documented.  Last Vitals:  Vitals:   07/14/24 1430 07/14/24 1449  BP: 126/68 128/75  Pulse: 75 79  Resp: 16 18  Temp:  (!) 36.3 C  SpO2: 94% 95%    Last Pain:  Vitals:   07/14/24 1449  TempSrc:   PainSc: 7                  Butler Levander Pinal      "

## 2024-07-14 NOTE — Anesthesia Procedure Notes (Signed)
 Procedure Name: Intubation Date/Time: 07/14/2024 12:25 PM  Performed by: Lenard Lacks, CRNAPre-anesthesia Checklist: Patient identified, Emergency Drugs available, Suction available and Patient being monitored Patient Re-evaluated:Patient Re-evaluated prior to induction Oxygen Delivery Method: Circle system utilized Preoxygenation: Pre-oxygenation with 100% oxygen Induction Type: IV induction Ventilation: Oral airway inserted - appropriate to patient size Laryngoscope Size: Mac and 4 Grade View: Grade I Tube type: Oral Tube size: 7.0 mm Number of attempts: 1 Airway Equipment and Method: Stylet and Oral airway Placement Confirmation: ETT inserted through vocal cords under direct vision, positive ETCO2 and breath sounds checked- equal and bilateral Tube secured with: Tape Dental Injury: Teeth and Oropharynx as per pre-operative assessment

## 2024-07-14 NOTE — Transfer of Care (Signed)
 Immediate Anesthesia Transfer of Care Note  Patient: Kevin Huff  Procedure(s) Performed: REPAIR, HERNIA, INGUINAL, ADULT (Right: Groin)  Patient Location: PACU  Anesthesia Type:General and Regional  Level of Consciousness: drowsy  Airway & Oxygen Therapy: Patient Spontanous Breathing and Patient connected to face mask oxygen  Post-op Assessment: Report given to RN and Post -op Vital signs reviewed and stable  Post vital signs: Reviewed and stable  Last Vitals:  Vitals Value Taken Time  BP 132/88 07/14/24 14:03  Temp    Pulse 85 07/14/24 14:05  Resp 21 07/14/24 14:05  SpO2 98 % 07/14/24 14:05  Vitals shown include unfiled device data.  Last Pain:  Vitals:   07/14/24 1057  TempSrc: Temporal  PainSc: 6          Complications: No notable events documented.

## 2024-07-14 NOTE — Discharge Instructions (Addendum)
 No Ibuprofen /Motrin /Aleve  (NSAIDS) before 10pm tonight.   Post Anesthesia Home Care Instructions  Activity: Get plenty of rest for the remainder of the day. A responsible individual must stay with you for 24 hours following the procedure.  For the next 24 hours, DO NOT: -Drive a car -Advertising copywriter -Drink alcoholic beverages -Take any medication unless instructed by your physician -Make any legal decisions or sign important papers.  Meals: Start with liquid foods such as gelatin or soup. Progress to regular foods as tolerated. Avoid greasy, spicy, heavy foods. If nausea and/or vomiting occur, drink only clear liquids until the nausea and/or vomiting subsides. Call your physician if vomiting continues.  Special Instructions/Symptoms: Your throat may feel dry or sore from the anesthesia or the breathing tube placed in your throat during surgery. If this causes discomfort, gargle with warm salt water. The discomfort should disappear within 24 hours.  If you had a scopolamine patch placed behind your ear for the management of post- operative nausea and/or vomiting:  1. The medication in the patch is effective for 72 hours, after which it should be removed.  Wrap patch in a tissue and discard in the trash. Wash hands thoroughly with soap and water. 2. You may remove the patch earlier than 72 hours if you experience unpleasant side effects which may include dry mouth, dizziness or visual disturbances. 3. Avoid touching the patch. Wash your hands with soap and water after contact with the patch.   [facility]  UMBILICAL OR INGUINAL HERNIA REPAIR: POST OP INSTRUCTIONS  Always review your discharge instruction sheet given to you by the facility where your surgery was performed. IF YOU HAVE DISABILITY OR FAMILY LEAVE FORMS, YOU MUST BRING THEM TO THE OFFICE FOR PROCESSING.   DO NOT GIVE THEM TO YOUR DOCTOR.  1. A  prescription for pain medication may be given  to you upon discharge.  Take your pain medication as prescribed, if needed.  If narcotic pain medicine is not needed, then you may take acetaminophen  (Tylenol ) or ibuprofen  (Advil ) as needed. 2. Take your usually prescribed medications unless otherwise directed. If you need a refill on your pain medication, please contact your pharmacy.  They will contact our office to request authorization. Prescriptions will not be filled after 5 pm or on week-ends. 3. You should follow a light diet the first 24 hours after arrival home, such as soup and crackers, etc.  Be sure to include lots of fluids daily.  Resume your normal diet the day after surgery. 4.Most patients will experience some swelling and bruising around the umbilicus or in the groin and scrotum.  Ice packs and reclining will help.  Swelling and bruising can take several days to resolve.  6. It is common to experience some constipation if taking pain medication after surgery.  Increasing fluid intake and taking a stool softener (such as Colace) will usually help or prevent this problem from occurring.  A mild laxative (Milk of Magnesia or Miralax) should be taken according to package directions if there are no bowel movements after 48 hours. 7. Unless discharge instructions indicate otherwise, you may remove your bandages 24-48 hours after surgery, and you may shower at that time.  You may have steri-strips (small skin tapes) in place directly over the incision.  These strips should be left on the skin for 7-10 days.  If your surgeon used skin glue on the incision, you may shower in 24 hours.  The glue will flake off over  the next 2-3 weeks.  Any sutures or staples will be removed at the office during your follow-up visit. 8. ACTIVITIES:  You may resume regular (light) daily activities beginning the next day--such as daily self-care, walking, climbing stairs--gradually increasing activities as tolerated.  You may have sexual intercourse when it is  comfortable.  Refrain from any heavy lifting or straining until approved by your doctor.  a.You may drive when you are no longer taking prescription pain medication, you can comfortably wear a seatbelt, and you can safely maneuver your car and apply brakes. b.RETURN TO WORK:   _____________________________________________  9.You should see your doctor in the office for a follow-up appointment approximately 2-3 weeks after your surgery.  Make sure that you call for this appointment within a day or two after you arrive home to insure a convenient appointment time. 10.OTHER INSTRUCTIONS: _________________________    _____________________________________  WHEN TO CALL YOUR DOCTOR: Fever over 101.0 Inability to urinate Nausea and/or vomiting Extreme swelling or bruising Continued bleeding from incision. Increased pain, redness, or drainage from the incision  The clinic staff is available to answer your questions during regular business hours.  Please dont hesitate to call and ask to speak to one of the nurses for clinical concerns.  If you have a medical emergency, go to the nearest emergency room or call 911.  A surgeon from [facility] is always on call at the hospital   [address], [address], [place], [place]  [address] ?  [address] [address], [place], [place]   [address] [phone] ? [phone] ? FAX [phone] Web site: [email or web address]

## 2024-07-14 NOTE — Anesthesia Procedure Notes (Signed)
 Anesthesia Regional Block: TAP block   Pre-Anesthetic Checklist: , timeout performed,  Correct Patient, Correct Site, Correct Laterality,  Correct Procedure, Correct Position, site marked,  Risks and benefits discussed,  Surgical consent,  Pre-op evaluation,  At surgeon's request and post-op pain management  Laterality: Right  Prep: chloraprep       Needles:  Injection technique: Single-shot  Needle Type: Stimiplex     Needle Length: 9cm  Needle Gauge: 21     Additional Needles:   Procedures:,,,, ultrasound used (permanent image in chart),,    Narrative:  Start time: 07/14/2024 11:30 AM End time: 07/14/2024 11:35 AM Injection made incrementally with aspirations every 5 mL.  Performed by: Personally  Anesthesiologist: Cleotilde Butler Dade, MD

## 2024-07-15 NOTE — Addendum Note (Signed)
 Addendum  created 07/15/24 0724 by Donnell Berwyn SQUIBB, CRNA   Intraprocedure Event edited

## 2024-07-16 ENCOUNTER — Encounter (HOSPITAL_BASED_OUTPATIENT_CLINIC_OR_DEPARTMENT_OTHER): Payer: Self-pay | Admitting: Surgery
